# Patient Record
Sex: Female | Born: 1960 | Race: White | Hispanic: No | Marital: Married | Smoking: Former smoker
Health system: Southern US, Community
[De-identification: ages and names within clinical notes are randomized; demographics above are authoritative.]

## PROBLEM LIST (undated history)

## (undated) DIAGNOSIS — R002 Palpitations: Secondary | ICD-10-CM

## (undated) DIAGNOSIS — C801 Malignant (primary) neoplasm, unspecified: Secondary | ICD-10-CM

## (undated) DIAGNOSIS — K219 Gastro-esophageal reflux disease without esophagitis: Secondary | ICD-10-CM

## (undated) DIAGNOSIS — I10 Essential (primary) hypertension: Secondary | ICD-10-CM

## (undated) DIAGNOSIS — K635 Polyp of colon: Secondary | ICD-10-CM

## (undated) DIAGNOSIS — N879 Dysplasia of cervix uteri, unspecified: Secondary | ICD-10-CM

## (undated) DIAGNOSIS — E785 Hyperlipidemia, unspecified: Secondary | ICD-10-CM

## (undated) DIAGNOSIS — F329 Major depressive disorder, single episode, unspecified: Secondary | ICD-10-CM

## (undated) DIAGNOSIS — F418 Other specified anxiety disorders: Secondary | ICD-10-CM

## (undated) DIAGNOSIS — F32A Depression, unspecified: Secondary | ICD-10-CM

## (undated) DIAGNOSIS — E538 Deficiency of other specified B group vitamins: Secondary | ICD-10-CM

## (undated) DIAGNOSIS — I4891 Unspecified atrial fibrillation: Secondary | ICD-10-CM

## (undated) HISTORY — DX: Depression, unspecified: F32.A

## (undated) HISTORY — DX: Gastro-esophageal reflux disease without esophagitis: K21.9

## (undated) HISTORY — DX: Palpitations: R00.2

## (undated) HISTORY — PX: COLPOSCOPY: SHX161

## (undated) HISTORY — DX: Other specified anxiety disorders: F41.8

## (undated) HISTORY — PX: MOUTH SURGERY: SHX715

## (undated) HISTORY — DX: Polyp of colon: K63.5

## (undated) HISTORY — DX: Dysplasia of cervix uteri, unspecified: N87.9

## (undated) HISTORY — DX: Hyperlipidemia, unspecified: E78.5

## (undated) HISTORY — DX: Major depressive disorder, single episode, unspecified: F32.9

## (undated) HISTORY — DX: Malignant (primary) neoplasm, unspecified: C80.1

## (undated) HISTORY — DX: Essential (primary) hypertension: I10

## (undated) HISTORY — DX: Deficiency of other specified B group vitamins: E53.8

---
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
## 1985-02-13 HISTORY — PX: GYNECOLOGIC CRYOSURGERY: SHX857

## 1999-04-12 ENCOUNTER — Other Ambulatory Visit: Admission: RE | Admit: 1999-04-12 | Discharge: 1999-04-12 | Payer: Self-pay | Admitting: Family Medicine

## 2000-05-22 ENCOUNTER — Encounter: Admission: RE | Admit: 2000-05-22 | Discharge: 2000-05-22 | Payer: Self-pay | Admitting: Family Medicine

## 2000-05-22 ENCOUNTER — Encounter: Payer: Self-pay | Admitting: Family Medicine

## 2001-03-12 ENCOUNTER — Encounter: Payer: Self-pay | Admitting: Family Medicine

## 2001-03-12 ENCOUNTER — Encounter: Admission: RE | Admit: 2001-03-12 | Discharge: 2001-03-12 | Payer: Self-pay | Admitting: Family Medicine

## 2001-03-14 ENCOUNTER — Encounter: Payer: Self-pay | Admitting: Family Medicine

## 2001-03-14 ENCOUNTER — Encounter: Admission: RE | Admit: 2001-03-14 | Discharge: 2001-03-14 | Payer: Self-pay | Admitting: Family Medicine

## 2001-03-14 ENCOUNTER — Other Ambulatory Visit: Admission: RE | Admit: 2001-03-14 | Discharge: 2001-03-14 | Payer: Self-pay | Admitting: Family Medicine

## 2002-03-20 ENCOUNTER — Encounter: Payer: Self-pay | Admitting: Family Medicine

## 2002-03-20 ENCOUNTER — Encounter: Admission: RE | Admit: 2002-03-20 | Discharge: 2002-03-20 | Payer: Self-pay | Admitting: Family Medicine

## 2002-04-03 ENCOUNTER — Other Ambulatory Visit: Admission: RE | Admit: 2002-04-03 | Discharge: 2002-04-03 | Payer: Self-pay | Admitting: Family Medicine

## 2003-03-24 ENCOUNTER — Encounter: Admission: RE | Admit: 2003-03-24 | Discharge: 2003-03-24 | Payer: Self-pay | Admitting: Family Medicine

## 2003-04-14 ENCOUNTER — Other Ambulatory Visit: Admission: RE | Admit: 2003-04-14 | Discharge: 2003-04-14 | Payer: Self-pay | Admitting: Family Medicine

## 2003-05-21 ENCOUNTER — Encounter: Admission: RE | Admit: 2003-05-21 | Discharge: 2003-05-21 | Payer: Self-pay | Admitting: Family Medicine

## 2004-04-08 ENCOUNTER — Encounter: Admission: RE | Admit: 2004-04-08 | Discharge: 2004-04-08 | Payer: Self-pay | Admitting: Family Medicine

## 2004-04-13 ENCOUNTER — Other Ambulatory Visit: Admission: RE | Admit: 2004-04-13 | Discharge: 2004-04-13 | Payer: Self-pay | Admitting: Family Medicine

## 2004-11-22 ENCOUNTER — Encounter: Admission: RE | Admit: 2004-11-22 | Discharge: 2004-11-22 | Payer: Self-pay | Admitting: Family Medicine

## 2004-12-26 ENCOUNTER — Encounter: Admission: RE | Admit: 2004-12-26 | Discharge: 2004-12-26 | Payer: Self-pay | Admitting: Family Medicine

## 2005-04-25 ENCOUNTER — Encounter: Admission: RE | Admit: 2005-04-25 | Discharge: 2005-04-25 | Payer: Self-pay | Admitting: Family Medicine

## 2005-05-15 ENCOUNTER — Other Ambulatory Visit: Admission: RE | Admit: 2005-05-15 | Discharge: 2005-05-15 | Payer: Self-pay | Admitting: Family Medicine

## 2006-05-01 ENCOUNTER — Encounter: Admission: RE | Admit: 2006-05-01 | Discharge: 2006-05-01 | Payer: Self-pay | Admitting: Obstetrics and Gynecology

## 2006-05-17 ENCOUNTER — Other Ambulatory Visit: Admission: RE | Admit: 2006-05-17 | Discharge: 2006-05-17 | Payer: Self-pay | Admitting: Obstetrics and Gynecology

## 2007-05-20 ENCOUNTER — Encounter: Admission: RE | Admit: 2007-05-20 | Discharge: 2007-05-20 | Payer: Self-pay | Admitting: Family Medicine

## 2007-05-20 ENCOUNTER — Other Ambulatory Visit: Admission: RE | Admit: 2007-05-20 | Discharge: 2007-05-20 | Payer: Self-pay | Admitting: Obstetrics and Gynecology

## 2008-02-12 ENCOUNTER — Ambulatory Visit: Payer: Self-pay | Admitting: Obstetrics and Gynecology

## 2008-04-06 ENCOUNTER — Ambulatory Visit: Payer: Self-pay | Admitting: Obstetrics and Gynecology

## 2008-05-22 ENCOUNTER — Other Ambulatory Visit: Admission: RE | Admit: 2008-05-22 | Discharge: 2008-05-22 | Payer: Self-pay | Admitting: Obstetrics and Gynecology

## 2008-05-22 ENCOUNTER — Encounter: Admission: RE | Admit: 2008-05-22 | Discharge: 2008-05-22 | Payer: Self-pay | Admitting: Family Medicine

## 2008-05-22 ENCOUNTER — Ambulatory Visit: Payer: Self-pay | Admitting: Obstetrics and Gynecology

## 2008-05-22 ENCOUNTER — Encounter: Payer: Self-pay | Admitting: Obstetrics and Gynecology

## 2009-06-02 ENCOUNTER — Ambulatory Visit: Payer: Self-pay | Admitting: Obstetrics and Gynecology

## 2009-06-02 ENCOUNTER — Encounter: Admission: RE | Admit: 2009-06-02 | Discharge: 2009-06-02 | Payer: Self-pay | Admitting: Family Medicine

## 2009-06-02 ENCOUNTER — Other Ambulatory Visit: Admission: RE | Admit: 2009-06-02 | Discharge: 2009-06-02 | Payer: Self-pay | Admitting: Obstetrics and Gynecology

## 2010-04-14 ENCOUNTER — Emergency Department (HOSPITAL_COMMUNITY)
Admission: EM | Admit: 2010-04-14 | Discharge: 2010-04-15 | Disposition: A | Discharge status: Home / Self Care | Payer: BC Managed Care – PPO | Attending: Emergency Medicine | Admitting: Emergency Medicine

## 2010-04-14 ENCOUNTER — Emergency Department (HOSPITAL_COMMUNITY): Payer: BC Managed Care – PPO

## 2010-04-14 DIAGNOSIS — S0083XA Contusion of other part of head, initial encounter: Secondary | ICD-10-CM | POA: Insufficient documentation

## 2010-04-14 DIAGNOSIS — W108XXA Fall (on) (from) other stairs and steps, initial encounter: Secondary | ICD-10-CM | POA: Insufficient documentation

## 2010-04-14 DIAGNOSIS — Y929 Unspecified place or not applicable: Secondary | ICD-10-CM | POA: Insufficient documentation

## 2010-04-14 DIAGNOSIS — F1010 Alcohol abuse, uncomplicated: Secondary | ICD-10-CM | POA: Insufficient documentation

## 2010-04-14 DIAGNOSIS — I10 Essential (primary) hypertension: Secondary | ICD-10-CM | POA: Insufficient documentation

## 2010-04-14 DIAGNOSIS — S12300A Unspecified displaced fracture of fourth cervical vertebra, initial encounter for closed fracture: Secondary | ICD-10-CM | POA: Insufficient documentation

## 2010-04-14 DIAGNOSIS — S0100XA Unspecified open wound of scalp, initial encounter: Secondary | ICD-10-CM | POA: Insufficient documentation

## 2010-04-14 DIAGNOSIS — S1093XA Contusion of unspecified part of neck, initial encounter: Secondary | ICD-10-CM | POA: Insufficient documentation

## 2010-04-14 DIAGNOSIS — IMO0002 Reserved for concepts with insufficient information to code with codable children: Secondary | ICD-10-CM | POA: Insufficient documentation

## 2010-04-14 DIAGNOSIS — S0003XA Contusion of scalp, initial encounter: Secondary | ICD-10-CM | POA: Insufficient documentation

## 2010-04-14 LAB — COMPREHENSIVE METABOLIC PANEL
ALT: 19 U/L (ref 0–35)
AST: 28 U/L (ref 0–37)
Alkaline Phosphatase: 57 U/L (ref 39–117)
CO2: 27 mEq/L (ref 19–32)
Calcium: 9.8 mg/dL (ref 8.4–10.5)
Chloride: 94 mEq/L — ABNORMAL LOW (ref 96–112)
GFR calc Af Amer: >60 mL/min (ref >60)
GFR calc non Af Amer: >60 mL/min (ref >60)
Glucose, Bld: 96 mg/dL (ref 70–99)
Potassium: 3.6 mEq/L (ref 3.5–5.1)
Sodium: 134 mEq/L — ABNORMAL LOW (ref 135–145)

## 2010-04-14 LAB — DIFFERENTIAL
Basophils Absolute: 0 10*3/uL (ref 0.0–0.1)
Eosinophils Relative: 2 % (ref 0–5)
Lymphocytes Relative: 34 % (ref 12–46)
Lymphs Abs: 2.2 10*3/uL (ref 0.7–4.0)
Neutro Abs: 3.6 10*3/uL (ref 1.7–7.7)

## 2010-04-14 LAB — CBC
HCT: 37.1 % (ref 36.0–46.0)
Hemoglobin: 13.2 g/dL (ref 12.0–15.0)
MCV: 91.6 fL (ref 78.0–100.0)
RBC: 4.05 MIL/uL (ref 3.87–5.11)
RDW: 12.2 % (ref 11.5–15.5)
WBC: 6.6 10*3/uL (ref 4.0–10.5)

## 2010-05-13 ENCOUNTER — Other Ambulatory Visit: Payer: Self-pay | Admitting: Family Medicine

## 2010-05-13 DIAGNOSIS — Z1231 Encounter for screening mammogram for malignant neoplasm of breast: Secondary | ICD-10-CM

## 2010-06-06 ENCOUNTER — Ambulatory Visit: Payer: BC Managed Care – PPO

## 2010-06-07 ENCOUNTER — Encounter (INDEPENDENT_AMBULATORY_CARE_PROVIDER_SITE_OTHER): Payer: BC Managed Care – PPO | Admitting: Obstetrics and Gynecology

## 2010-06-07 ENCOUNTER — Other Ambulatory Visit: Payer: Self-pay | Admitting: Obstetrics and Gynecology

## 2010-06-07 ENCOUNTER — Other Ambulatory Visit (HOSPITAL_COMMUNITY)
Admission: RE | Admit: 2010-06-07 | Discharge: 2010-06-07 | Disposition: A | Discharge status: Home / Self Care | Payer: BC Managed Care – PPO | Source: Ambulatory Visit | Attending: Obstetrics and Gynecology | Admitting: Obstetrics and Gynecology

## 2010-06-07 ENCOUNTER — Ambulatory Visit
Admission: RE | Admit: 2010-06-07 | Discharge: 2010-06-07 | Disposition: A | Discharge status: Home / Self Care | Payer: BC Managed Care – PPO | Source: Ambulatory Visit | Attending: Family Medicine | Admitting: Family Medicine

## 2010-06-07 DIAGNOSIS — Z1231 Encounter for screening mammogram for malignant neoplasm of breast: Secondary | ICD-10-CM

## 2010-06-07 DIAGNOSIS — Z01419 Encounter for gynecological examination (general) (routine) without abnormal findings: Secondary | ICD-10-CM

## 2010-06-07 DIAGNOSIS — Z124 Encounter for screening for malignant neoplasm of cervix: Secondary | ICD-10-CM | POA: Insufficient documentation

## 2011-06-08 ENCOUNTER — Other Ambulatory Visit: Payer: Self-pay | Admitting: Family Medicine

## 2011-06-08 DIAGNOSIS — Z1231 Encounter for screening mammogram for malignant neoplasm of breast: Secondary | ICD-10-CM

## 2011-06-09 ENCOUNTER — Ambulatory Visit
Admission: RE | Admit: 2011-06-09 | Discharge: 2011-06-09 | Disposition: A | Discharge status: Home / Self Care | Payer: BC Managed Care – PPO | Source: Ambulatory Visit | Attending: Family Medicine | Admitting: Family Medicine

## 2011-06-09 DIAGNOSIS — Z1231 Encounter for screening mammogram for malignant neoplasm of breast: Secondary | ICD-10-CM

## 2011-06-12 ENCOUNTER — Encounter: Payer: Self-pay | Admitting: Gynecology

## 2011-06-12 DIAGNOSIS — N879 Dysplasia of cervix uteri, unspecified: Secondary | ICD-10-CM | POA: Insufficient documentation

## 2011-06-13 ENCOUNTER — Encounter: Payer: BC Managed Care – PPO | Admitting: Obstetrics and Gynecology

## 2011-06-14 ENCOUNTER — Encounter: Payer: Self-pay | Admitting: *Deleted

## 2011-06-29 ENCOUNTER — Encounter: Payer: Self-pay | Admitting: Obstetrics and Gynecology

## 2011-06-29 ENCOUNTER — Other Ambulatory Visit (HOSPITAL_COMMUNITY)
Admission: RE | Admit: 2011-06-29 | Discharge: 2011-06-29 | Disposition: A | Discharge status: Home / Self Care | Payer: BC Managed Care – PPO | Source: Ambulatory Visit | Attending: Obstetrics and Gynecology | Admitting: Obstetrics and Gynecology

## 2011-06-29 ENCOUNTER — Ambulatory Visit (INDEPENDENT_AMBULATORY_CARE_PROVIDER_SITE_OTHER): Payer: BC Managed Care – PPO | Admitting: Obstetrics and Gynecology

## 2011-06-29 VITALS — BP 140/84 | Ht 61.0 in | Wt 140.0 lb

## 2011-06-29 DIAGNOSIS — N951 Menopausal and female climacteric states: Secondary | ICD-10-CM

## 2011-06-29 DIAGNOSIS — F418 Other specified anxiety disorders: Secondary | ICD-10-CM | POA: Insufficient documentation

## 2011-06-29 DIAGNOSIS — R002 Palpitations: Secondary | ICD-10-CM | POA: Insufficient documentation

## 2011-06-29 DIAGNOSIS — Z01419 Encounter for gynecological examination (general) (routine) without abnormal findings: Secondary | ICD-10-CM | POA: Insufficient documentation

## 2011-06-29 DIAGNOSIS — Z780 Asymptomatic menopausal state: Secondary | ICD-10-CM

## 2011-06-29 NOTE — Progress Notes (Signed)
Patient came to see me today for her annual GYN exam. She is menopausal and does well without HRT. She does have some vaginal dryness but at the moment is tolerable she is having no vaginal bleeding. She is having no pelvic pain. She has been treated by her PCP for hypertension. Last week she was in [place] and developed a severe headache and her systolic was over 200. Her PCP is now admitted a beta blocker and she's doing much better. Her cholesterol is elevated. She is working on diet and exercise and at some point may require statin. She is up-to-date on mammograms. She had one bone density in 2010 which was normal but she does not take estrogen. She had cryosurgery for CIN in 1987 but normal Paps since then. We discussed less frequent Pap smears but she requested we do one today.  Physical examination: Kennon Portela present. HEENT within normal limits. Neck: Thyroid not large. No masses. Supraclavicular nodes: not enlarged. Breasts: Examined in both sitting and lying  position. No skin changes and no masses. Abdomen: Soft no guarding rebound or masses or hernia. Pelvic: External: Within normal limits. BUS: Within normal limits. Vaginal:within normal limits. Good estrogen effect. No evidence of cystocele rectocele or enterocele. Cervix: clean. Uterus: Normal size and shape. Adnexa: No masses. Rectovaginal exam: Confirmatory and negative. Extremities: Within normal limits.  Assessment #1. Normal GYN exam #2. History of CIN with cryosurgery  Plan: Continue yearly mammograms. Bone density.

## 2011-06-29 NOTE — Patient Instructions (Signed)
Schedule colonoscopy at 51.

## 2012-05-17 ENCOUNTER — Other Ambulatory Visit: Payer: Self-pay

## 2012-05-17 DIAGNOSIS — Z1231 Encounter for screening mammogram for malignant neoplasm of breast: Secondary | ICD-10-CM

## 2012-06-19 ENCOUNTER — Ambulatory Visit
Admission: RE | Admit: 2012-06-19 | Discharge: 2012-06-19 | Disposition: A | Discharge status: Home / Self Care | Payer: BC Managed Care – PPO | Source: Ambulatory Visit

## 2012-06-19 DIAGNOSIS — Z1231 Encounter for screening mammogram for malignant neoplasm of breast: Secondary | ICD-10-CM

## 2012-08-26 HISTORY — PX: COLONOSCOPY W/ BIOPSIES: SHX1374

## 2013-02-26 ENCOUNTER — Encounter: Payer: Self-pay | Admitting: Internal Medicine

## 2013-04-09 ENCOUNTER — Ambulatory Visit (INDEPENDENT_AMBULATORY_CARE_PROVIDER_SITE_OTHER): Payer: BC Managed Care – PPO | Admitting: Internal Medicine

## 2013-04-09 ENCOUNTER — Encounter: Payer: Self-pay | Admitting: Internal Medicine

## 2013-04-09 VITALS — BP 132/68 | HR 88 | Ht 61.0 in | Wt 148.0 lb

## 2013-04-09 DIAGNOSIS — K219 Gastro-esophageal reflux disease without esophagitis: Secondary | ICD-10-CM

## 2013-04-09 DIAGNOSIS — R12 Heartburn: Secondary | ICD-10-CM

## 2013-04-09 MED ORDER — PANTOPRAZOLE SODIUM 40 MG PO TBEC: 40.0000 mg | DELAYED_RELEASE_TABLET | Freq: Every day | ORAL | Status: DC

## 2013-04-09 NOTE — Progress Notes (Signed)
Subjective:  Referred by Florina Ou, MD  Patient ID: Gwendolyn Thompson, female    DOB: Sep 10, 1960, 53 y.o.   MRN: [id number]  HPI The patient is a pleasant middle-aged woman with a several month if not longer hx of nausea and intermittent urgent regurgitation. It often occurs with running. There is also heartburn and nausea and a gagging sensation at times. No dysphagia, weight loss, anemia known. She does not have chest pain, doe. Weight has gone up with BP med changes (Dr. Einar Gip).  She finds Tums and Gaviscon help her sxs.   GI ROS o/w negative. Allergies  Allergen Reactions  . Penicillins    Outpatient Prescriptions Prior to Visit  Medication Sig Dispense Refill  . ALPRAZolam (XANAX) 0.5 MG tablet Take 0.5 mg by mouth as needed.      . Ascorbic Acid (VITAMIN C PO) Take by mouth.      Marland Kitchen aspirin 81 MG tablet Take 81 mg by mouth daily.      . Calcium Carbonate-Vitamin D (CALCIUM + D PO) Take by mouth.      . Cetirizine HCl (ZYRTEC ALLERGY PO) Take by mouth.      . Coenzyme Q10 (CO Q 10 PO) Take by mouth.      . Omega-3 Fatty Acids (FISH OIL PO) Take by mouth.      . Sertraline HCl (ZOLOFT PO) Take 150 mg by mouth.       . hydrochlorothiazide (HYDRODIURIL) 25 MG tablet Take 25 mg by mouth daily.      Marland Kitchen LISINOPRIL PO Take 10 mg by mouth.       Marland Kitchen PRESCRIPTION MEDICATION Beta Blocker       No facility-administered medications prior to visit.   Past Medical History  Diagnosis Date  . Cervical dysplasia   . Palpitations   . HTN (hypertension)   . Anxious depression   . Hyperplastic colon polyp    Past Surgical History  Procedure Laterality Date  . Colposcopy    . Gynecologic cryosurgery  1987  . Mouth surgery    . Colonoscopy w/ biopsies  08-26-2012    Dr. Glennon Hamilton    History   Social History  . Marital Status: Single    Spouse Name: N/A    Number of Children: N/A  . Years of Education: N/A   Occupational History  . tax Optometrist    Social History Main Topics  .  Smoking status: Former Smoker    Quit date: 02/14/1988  . Smokeless tobacco: Never Used  . Alcohol Use: 1.0 oz/week    2 drink(s) per week  . Drug Use: No  . Sexual Activity: Yes    Birth Control/ Protection: Post-menopausal     Family History  Problem Relation Age of Onset  . Hypertension Mother   . Alzheimer's disease Father   . Leukemia Father   . Diabetes Maternal Grandmother   . Alzheimer's disease Maternal Grandmother   . Diabetes Paternal Grandfather   . Heart disease Sister   . Alzheimer's disease Paternal Uncle    Review of Systems As per HPI  And  O/w negative complete ROS    Objective:   Physical Exam General:  Well-developed, well-nourished and in no acute distress Eyes:  anicteric. ENT:   Mouth and posterior pharynx free of lesions.  Neck:   supple w/o thyromegaly or mass.  Lungs: Clear to auscultation bilaterally. Heart:  S1S2, no rubs, murmurs, gallops. Abdomen:  soft,  non-tender, no hepatosplenomegaly, hernia, or mass and BS+.  Lymph:  no cervical or supraclavicular adenopathy. Extremities:   no edema Skin   no rash, tanned. Neuro:  A&O x 3.  Psych:  appropriate mood and  Affect.   Data Reviewed: PCP notes, labs (01/2013 - NL CBC, CMET), colonoscopy 08/2012 and pathology (hyperplastic polyp)     Assessment & Plan:  Heartburn - Plan: pantoprazole (PROTONIX) 40 MG tablet  Esophageal reflux - Plan: pantoprazole (PROTONIX) 40 MG tablet  1. I suspect here sxs are from GERD and we have decided to use daily PPI and f/u 2 months. 2. If persistent problems consider EGD or UGI as hiatal hernia could be a problem. 3. If responds then consider dose reduction of PPI 4. Sxs do not sound cardiac to me  I appreciate the opportunity to care for this patient.  SF:SELTR, TAMMY, MD

## 2013-04-09 NOTE — Patient Instructions (Signed)
We have sent the following medications to your pharmacy for you to pick up at your convenience: Pantoprazole  Today we are giving you a handout to read and follow on GERD.  Follow up with [place] in 2 months.   I appreciate the opportunity to care for you.

## 2013-04-11 ENCOUNTER — Encounter: Payer: Self-pay | Admitting: Internal Medicine

## 2013-06-04 ENCOUNTER — Ambulatory Visit: Payer: BC Managed Care – PPO | Admitting: Internal Medicine

## 2013-06-10 ENCOUNTER — Other Ambulatory Visit: Payer: Self-pay

## 2013-06-10 DIAGNOSIS — Z1231 Encounter for screening mammogram for malignant neoplasm of breast: Secondary | ICD-10-CM

## 2013-06-12 ENCOUNTER — Other Ambulatory Visit: Payer: Self-pay

## 2013-07-10 ENCOUNTER — Other Ambulatory Visit: Payer: Self-pay | Admitting: Internal Medicine

## 2013-07-17 ENCOUNTER — Encounter: Payer: Self-pay | Admitting: Internal Medicine

## 2013-07-17 ENCOUNTER — Encounter (INDEPENDENT_AMBULATORY_CARE_PROVIDER_SITE_OTHER): Payer: Self-pay

## 2013-07-17 ENCOUNTER — Ambulatory Visit (INDEPENDENT_AMBULATORY_CARE_PROVIDER_SITE_OTHER): Payer: BC Managed Care – PPO | Admitting: Internal Medicine

## 2013-07-17 ENCOUNTER — Ambulatory Visit
Admission: RE | Admit: 2013-07-17 | Discharge: 2013-07-17 | Disposition: A | Discharge status: Home / Self Care | Payer: BC Managed Care – PPO | Source: Ambulatory Visit

## 2013-07-17 ENCOUNTER — Ambulatory Visit: Payer: BC Managed Care – PPO

## 2013-07-17 VITALS — BP 104/66 | HR 100 | Ht 61.0 in | Wt 148.6 lb

## 2013-07-17 DIAGNOSIS — F411 Generalized anxiety disorder: Secondary | ICD-10-CM

## 2013-07-17 DIAGNOSIS — Z1231 Encounter for screening mammogram for malignant neoplasm of breast: Secondary | ICD-10-CM

## 2013-07-17 DIAGNOSIS — R12 Heartburn: Secondary | ICD-10-CM

## 2013-07-17 DIAGNOSIS — K219 Gastro-esophageal reflux disease without esophagitis: Secondary | ICD-10-CM

## 2013-07-17 NOTE — Patient Instructions (Addendum)
Please take an Anti-Acid before exercising.  If that doesn't help use a xanax before exercising.    Follow up with [place] in 2 months, appointment made for 09/17/13 at 1:30pm.   I appreciate the opportunity to care for you.

## 2013-07-17 NOTE — Progress Notes (Signed)
Subjective:    Patient ID: Gwendolyn Thompson, female    DOB: October 16, 1960, 53 y.o.   MRN: [id number]  HPI The patient returns for followup of GERD problems and gagging. Nausea also. She was seen in January and PPI therapy was recommended with followup. Since last visit - ill twice - flu, sinus infection  Felt heartbburn when late on PPI Gagging with exercise, esp if waiting too long after breakfast when she exercises will feel hungry and gag - 2-3 x in past month  Allergies  Allergen Reactions  . Penicillins    Outpatient Prescriptions Prior to Visit  Medication Sig Dispense Refill  . ALPRAZolam (XANAX) 0.5 MG tablet Take 0.5 mg by mouth as needed.      . AMBULATORY NON FORMULARY MEDICATION [facility] Immune Booster  2 tablets at breakfast      . AMBULATORY NON FORMULARY MEDICATION [facility] Herbal Respiratory Booster 2 tablets at breakfast      . amLODipine (NORVASC) 5 MG tablet Take 5 mg by mouth daily.      . Ascorbic Acid (VITAMIN C PO) Take by mouth.      Marland Kitchen aspirin 81 MG tablet Take 81 mg by mouth daily.      . calcium carbonate (TUMS - DOSED IN MG ELEMENTAL CALCIUM) 500 MG chewable tablet Chew 1 tablet by mouth as needed for indigestion or heartburn.      . Calcium Carbonate-Vitamin D (CALCIUM + D PO) Take by mouth.      . Cetirizine HCl (ZYRTEC ALLERGY PO) Take by mouth.      . Coenzyme Q10 (CO Q 10 PO) Take by mouth.      . nebivolol (BYSTOLIC) 10 MG tablet Take 5 mg by mouth daily.       . Omega-3 Fatty Acids (FISH OIL PO) Take by mouth.      . pantoprazole (PROTONIX) 40 MG tablet TAKE 1 TABLET (40 MG TOTAL) BY MOUTH DAILY BEFORE BREAKFAST.  30 tablet  2  . spironolactone (ALDACTONE) 25 MG tablet Take 25 mg by mouth daily.      Marland Kitchen venlafaxine (EFFEXOR) 75 MG tablet Take 75 mg by mouth at bedtime.       No facility-administered medications prior to visit.   Past Medical History  Diagnosis Date  . Cervical dysplasia   . Palpitations   . HTN (hypertension)   . Anxious  depression   . Hyperplastic colon polyp   . Esophageal reflux    Past Surgical History  Procedure Laterality Date  . Colposcopy    . Gynecologic cryosurgery  1987  . Mouth surgery    . Colonoscopy w/ biopsies  08-26-2012    Dr. Glennon Hamilton    History   Social History  . Marital Status: Single    Spouse Name: N/A    Number of Children: N/A  . Years of Education: N/A   Occupational History  . tax Optometrist    Social History Main Topics  . Smoking status: Former Smoker    Quit date: 02/14/1988  . Smokeless tobacco: Never Used  . Alcohol Use: 1.0 oz/week    2 drink(s) per week  . Drug Use: No  . Sexual Activity: Yes    Birth Control/ Protection: Post-menopausal   Other Topics Concern  . None   Social History Narrative   Single, no children   Tax Optometrist   2 caffeine beverages daily   Family History  Problem Relation Age of Onset  . Hypertension Mother   .  Alzheimer's disease Father   . Leukemia Father   . Diabetes Maternal Grandmother   . Alzheimer's disease Maternal Grandmother   . Diabetes Paternal Grandfather   . Heart disease Sister   . Alzheimer's disease Paternal Uncle        Review of Systems As above some anxiety and stress, concerned about gaining weight and overall health problems is developed in the past few years    Objective:   Physical Exam General:  NAD Eyes:   anicteric Abdomen:  soft and nontender, BS+    Assessment & Plan:   1. Heartburn   2. Esophageal reflux   3. Anxiety state, health-related    1. I tried to reassure as best I could. I can't tell if her symptoms are coming really from anxiety or GERD or both. There certainly atypical for GERD in that she tends to get symptoms when she exercises and if she is hungry. 2. We discussed the options of other therapeutic trial, upper GI series, upper GI endoscopy. 3. At this point she will try Gaviscon prior to exercising to see if that limits these relatively infrequent episodes and if  that fails to work I want her to try some of her alprazolam prior to exercising and see if that does. 4. She will return in 2 months, but call back sooner if she has not helped and we will most likely proceed with testing with upper GI series her EGD, I think EGD makes the most sense here. 5. She has a visit with Dr. Modena Morrow relatively soon and will have labs done there and she can have those forwarded to me. My index of suspicion of something serious is low she may need testing for reassurance however. Note that some of her cardiac medications could be related. Amlodipine is a calcium channel blocker which may predispose more reflux than she's been on that for some time.  ID:POEUM, TAMMY, MD

## 2013-09-17 ENCOUNTER — Ambulatory Visit (INDEPENDENT_AMBULATORY_CARE_PROVIDER_SITE_OTHER): Payer: BC Managed Care – PPO | Admitting: Internal Medicine

## 2013-09-17 ENCOUNTER — Encounter: Payer: Self-pay | Admitting: Internal Medicine

## 2013-09-17 VITALS — BP 140/80 | HR 68 | Ht 60.75 in | Wt 148.0 lb

## 2013-09-17 DIAGNOSIS — E538 Deficiency of other specified B group vitamins: Secondary | ICD-10-CM | POA: Insufficient documentation

## 2013-09-17 DIAGNOSIS — K219 Gastro-esophageal reflux disease without esophagitis: Secondary | ICD-10-CM | POA: Insufficient documentation

## 2013-09-17 DIAGNOSIS — F411 Generalized anxiety disorder: Secondary | ICD-10-CM | POA: Insufficient documentation

## 2013-09-17 HISTORY — DX: Deficiency of other specified B group vitamins: E53.8

## 2013-09-17 NOTE — Patient Instructions (Signed)
Follow up with [place] as needed.   Consider counseling.     I appreciate the opportunity to care for you.

## 2013-09-17 NOTE — Progress Notes (Signed)
   Subjective:    Patient ID: Gwendolyn Thompson, female    DOB: [birth year], 53 y.o.   MRN: [id number]  HPI The patient returns, reporting that as long as she takes her daily pantoprazole and as needed antacids before exercising she does not have heartburn or regurgitation symptoms. She has had some episodes when she's had coffee on an empty stomach. She does admit to a fair amount of anxiety as well and stressors. She has gotten off medication for that because she was on numerous medications. She is previously participated in counseling but not for some time. She is open doing that. She does not sleep well either.  We reviewed her labs to her patient portal and she recently had a CBC, TSH, comprehensive metabolic panel, all of which were okay. She had a borderline B12 of 210 and was started on oral supplementation. Medications, allergies, past medical history, past surgical history, family history and social history are reviewed and updated in the EMR.   Review of Systems As above    Objective:   Physical Exam NAD    Assessment & Plan:  Esophageal reflux Better with PPI Will work on diet also Stay on PPI F/u prn No egd as no warning signs  Anxiety state, unspecified Affecting her overall - decided to get off meds Has been to counselor before - suggested she try again  B12 deficiency-borderline Continue oral supplementation  I appreciate the opportunity to care for this patient.  CC: Florina Ou, MD

## 2013-09-17 NOTE — Assessment & Plan Note (Signed)
Better with PPI Will work on diet also Stay on PPI F/u prn No egd as no warning signs

## 2013-09-17 NOTE — Assessment & Plan Note (Signed)
Continue oral supplementation

## 2013-09-17 NOTE — Assessment & Plan Note (Addendum)
Affecting her overall - decided to get off meds Has been to counselor before - suggested she try again

## 2013-10-09 ENCOUNTER — Other Ambulatory Visit: Payer: Self-pay | Admitting: Internal Medicine

## 2013-12-12 ENCOUNTER — Encounter: Payer: Self-pay | Admitting: Internal Medicine

## 2014-09-07 ENCOUNTER — Other Ambulatory Visit: Payer: Self-pay

## 2014-09-07 DIAGNOSIS — Z1231 Encounter for screening mammogram for malignant neoplasm of breast: Secondary | ICD-10-CM

## 2014-10-09 ENCOUNTER — Ambulatory Visit (INDEPENDENT_AMBULATORY_CARE_PROVIDER_SITE_OTHER): Payer: BLUE CROSS/BLUE SHIELD | Admitting: Internal Medicine

## 2014-10-09 ENCOUNTER — Encounter: Payer: Self-pay | Admitting: Internal Medicine

## 2014-10-09 VITALS — BP 132/88 | HR 80 | Ht 61.0 in | Wt 149.1 lb

## 2014-10-09 DIAGNOSIS — K219 Gastro-esophageal reflux disease without esophagitis: Secondary | ICD-10-CM

## 2014-10-09 DIAGNOSIS — R112 Nausea with vomiting, unspecified: Secondary | ICD-10-CM

## 2014-10-09 DIAGNOSIS — F411 Generalized anxiety disorder: Secondary | ICD-10-CM | POA: Diagnosis not present

## 2014-10-09 DIAGNOSIS — R1110 Vomiting, unspecified: Secondary | ICD-10-CM

## 2014-10-09 NOTE — Assessment & Plan Note (Signed)
Likely driving problems here Needs cognitive based Tx I think plus review of meds as effexor might have caused BP increase ? OCD component

## 2014-10-09 NOTE — Progress Notes (Signed)
Subjective:    Patient ID: Gwendolyn Thompson, female    DOB: 02-Sep-1960, 54 y.o.   MRN: [id number] Cc: reflux, gagging HPI The patient returns for follow-up. She had been seen in the past for what I thought was probably reflux in the setting of anxiety. She seemed to be better on a PPI when I saw her last year. However since then she's had progressively increasing symptoms. What she is having is intermittent gagging or retching episodes. She actually had one here she says if she starts to think about it a will happen and she basically has dry heaves for a few seconds or so. 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 She has noticed when she takes her alprazolam for anxiety she does not have this problem. She denies overt heartburn and there is no dysphagia though sometimes she holds food at the back of her mouth and then has to drink water to make it go down. She has had situational stressors which make things worse such as the death of her partners mother but she also has periods at times her she just has free-floating anxiety. She is still frustrated about being on multiple medications and has fatigue, she is on antihypertensives. She has been on venlafaxine XR for a number of years. She was not aware that that could contribute to hypertension. Her primary care practice is closing and she needs to seek in [place]. She has not seen a psychologist or psychiatrist for anxiety issues in the past. She admits she is a type a compulsive-like person.  She is also recently aware of someone's family member being diagnosed with esophageal cancer and she is fearful of that occurring in her.  Medications, allergies, past medical history, past surgical history, family history and social history are reviewed and updated in the EMR.  Review of Systems As per history of present illness      Objective:   Physical Exam  BP 132/88 mmHg  Pulse 80  Ht 5\' 1"  (1.549 m)  Wt 149 lb 2 oz (67.643 kg)  BMI 28.19 kg/m2 No acute distress    Assessment & Plan:   1. Gastroesophageal reflux disease, esophagitis presence not specified   2. Dry heaves   3. Anxiety state    I think she has a psychophysiologic phenomena and most likely. A functional disturbance of her GI tract probably triggered by anxiety and stress. I think upper endoscopy though, is necessary to exclude any organic issues. She is fearful of having cancer so I think it will help her to have that excluded. I have scheduled her for an upper GI endoscopy.The risks and benefits as well as alternatives of endoscopic procedure(s) have been discussed and reviewed. All questions answered. The patient agrees to proceed.   Subsequent to that or perhaps before she can manage and she needs another primary care provider to look over her medications. I suspect she could benefit from a change in therapy I don't know if her hypertension is reversible if she comes often lacks saphenous a don't know for sure that that caused it but it could be related.  I also mentioned the possibility of of cognitive-based psychotherapy to try to help her with her issues and perhaps even seeing a psychiatrist for medication.   25 minutes time spent with patient > half in counseling  coordination of care

## 2014-10-09 NOTE — Patient Instructions (Addendum)
You have been scheduled for an endoscopy. Please follow written instructions given to you at your visit today. If you use inhalers (even only as needed), please bring them with you on the day of your procedure.   Per Dr Carlean Purl you may stop your pantoprazole by cutting back to every other day and then stopping.  I appreciate the opportunity to care for you.

## 2014-10-09 NOTE — Assessment & Plan Note (Signed)
EGD Probably all functional

## 2014-10-15 ENCOUNTER — Ambulatory Visit (AMBULATORY_SURGERY_CENTER): Payer: BLUE CROSS/BLUE SHIELD | Admitting: Internal Medicine

## 2014-10-15 ENCOUNTER — Encounter: Payer: Self-pay | Admitting: Internal Medicine

## 2014-10-15 VITALS — BP 124/79 | HR 74 | Temp 97.4°F | Resp 22 | Ht 61.0 in | Wt 149.0 lb

## 2014-10-15 DIAGNOSIS — K319 Disease of stomach and duodenum, unspecified: Secondary | ICD-10-CM | POA: Diagnosis not present

## 2014-10-15 DIAGNOSIS — K2990 Gastroduodenitis, unspecified, without bleeding: Secondary | ICD-10-CM | POA: Diagnosis not present

## 2014-10-15 DIAGNOSIS — K219 Gastro-esophageal reflux disease without esophagitis: Secondary | ICD-10-CM

## 2014-10-15 DIAGNOSIS — K2970 Gastritis, unspecified, without bleeding: Secondary | ICD-10-CM

## 2014-10-15 MED ORDER — SODIUM CHLORIDE 0.9 % IV SOLN
500.0000 mL | INTRAVENOUS | Status: DC
Start: 2014-10-15 — End: 2014-10-15

## 2014-10-15 NOTE — Progress Notes (Signed)
Transferred to recovery room. A/O x3, pleased with MAC.  VSS.  Report to Penny, RN. 

## 2014-10-15 NOTE — Patient Instructions (Addendum)
I found some gastritis - inflammation in stomach and took biopsies. It does not have anything to do with your symptoms but if there is an infection we will treat it.  I will call results and plans.  I appreciate the opportunity to care for you. Gatha Mayer, MD, FACG   YOU HAD AN ENDOSCOPIC PROCEDURE TODAY AT [facility]:   Refer to the procedure report that was given to you for any specific questions about what was found during the examination.  If the procedure report does not answer your questions, please call your gastroenterologist to clarify.  If you requested that your care partner not be given the details of your procedure findings, then the procedure report has been included in a sealed envelope for you to review at your convenience later.  YOU SHOULD EXPECT: Some feelings of bloating in the abdomen. Passage of more gas than usual.  Walking can help get rid of the air that was put into your GI tract during the procedure and reduce the bloating. If you had a lower endoscopy (such as a colonoscopy or flexible sigmoidoscopy) you may notice spotting of blood in your stool or on the toilet paper. If you underwent a bowel prep for your procedure, you may not have a normal bowel movement for a few days.  Please Note:  You might notice some irritation and congestion in your nose or some drainage.  This is from the oxygen used during your procedure.  There is no need for concern and it should clear up in a day or so.  SYMPTOMS TO REPORT IMMEDIATELY:     Following upper endoscopy (EGD)  Vomiting of blood or coffee ground material  New chest pain or pain under the shoulder blades  Painful or persistently difficult swallowing  New shortness of breath  Fever of 100F or higher  Black, tarry-looking stools  For urgent or emergent issues, a gastroenterologist can be reached at any hour by calling [phone].   DIET: Your first meal following the procedure  should be a small meal and then it is ok to progress to your normal diet. Heavy or fried foods are harder to digest and may make you feel nauseous or bloated.  Likewise, meals heavy in dairy and vegetables can increase bloating.  Drink plenty of fluids but you should avoid alcoholic beverages for 24 hours.  ACTIVITY:  You should plan to take it easy for the rest of today and you should NOT DRIVE or use heavy machinery until tomorrow (because of the sedation medicines used during the test).    FOLLOW UP: Our staff will call the number listed on your records the next business day following your procedure to check on you and address any questions or concerns that you may have regarding the information given to you following your procedure. If we do not reach you, we will leave a message.  However, if you are feeling well and you are not experiencing any problems, there is no need to return our call.  We will assume that you have returned to your regular daily activities without incident.  If any biopsies were taken you will be contacted by phone or by letter within the next 1-3 weeks.  Please call us at [phone] if you have not heard about the biopsies in 3 weeks.    SIGNATURES/CONFIDENTIALITY: You and/or your care partner have signed paperwork which will be entered into your electronic medical record.  These signatures  attest to the fact that that the information above on your After Visit Summary has been reviewed and is understood.  Full responsibility of the confidentiality of this discharge information lies with you and/or your care-partner.   Information on Gastritis given to you today

## 2014-10-15 NOTE — Progress Notes (Signed)
Called to room to assist during endoscopic procedure.  Patient ID and intended procedure confirmed with present staff. Received instructions for my participation in the procedure from the performing physician.  

## 2014-10-15 NOTE — Op Note (Signed)
[facility]  [address] [place], [id number]   ENDOSCOPY PROCEDURE REPORT  PATIENT: Gwendolyn, Thompson  MR#: [id number] BIRTHDATE: 12/04/1960 , 81  yrs. old GENDER: female ENDOSCOPIST: Gatha Mayer, MD, [facility] PROCEDURE DATE:  10/15/2014 PROCEDURE:  EGD w/ biopsy ASA CLASS:     Class II INDICATIONS:  heartburn and dysphagia. MEDICATIONS: Propofol 160 TOPICAL ANESTHETIC: none  DESCRIPTION OF PROCEDURE: After the risks benefits and alternatives of the procedure were thoroughly explained, informed consent was obtained.  The [facility] [id number] endoscope was introduced through the mouth and advanced to the second portion of the duodenum , Without limitations.  The instrument was slowly withdrawn as the mucosa was fully examined.    1) mild-moderate distal gatsritis - biopsied (erythematous and mottled).   2) Otherwise normal EGD.  Retroflexed views revealed as previously described.     The scope was then withdrawn from the patient and the procedure completed.  COMPLICATIONS: There were no immediate complications.  ENDOSCOPIC IMPRESSION: 1.   1) mild-moderate distal gatsritis - biopsied (erythematous and mottled) 2.   2) Otherwise normal EGD  RECOMMENDATIONS: Office will call with results  REPEAT EXAM:  eSigned:  Gatha Mayer, MD, [facility] 10/15/2014 7:57 AM    CC:The Patient

## 2014-10-16 ENCOUNTER — Telehealth: Payer: Self-pay | Admitting: *Deleted

## 2014-10-16 NOTE — Telephone Encounter (Signed)
  Follow up Call-  Call back number 10/15/2014  Post procedure Call Back phone  # [phone]  Permission to leave phone message Yes     Patient questions:  Message left to call us if necessary.

## 2014-10-21 ENCOUNTER — Ambulatory Visit
Admission: RE | Admit: 2014-10-21 | Discharge: 2014-10-21 | Disposition: A | Discharge status: Home / Self Care | Payer: BLUE CROSS/BLUE SHIELD | Source: Ambulatory Visit

## 2014-10-21 DIAGNOSIS — Z1231 Encounter for screening mammogram for malignant neoplasm of breast: Secondary | ICD-10-CM

## 2014-10-26 ENCOUNTER — Telehealth: Payer: Self-pay | Admitting: Internal Medicine

## 2014-10-26 NOTE — Progress Notes (Signed)
Quick Note:  Biopsies are ok She needs to schedule w/ psychology to evaluate anxiety and physical sxs (gagging, esophageal sxs) Gwendolyn Thompson [facility] psychology please   See me 2 months  Call from office please [place] no recall or letter   ______

## 2014-10-26 NOTE — Telephone Encounter (Signed)
See pathology report for additional details 

## 2015-01-01 ENCOUNTER — Ambulatory Visit: Payer: BLUE CROSS/BLUE SHIELD | Admitting: Internal Medicine

## 2015-09-23 ENCOUNTER — Other Ambulatory Visit: Payer: Self-pay | Admitting: Obstetrics & Gynecology

## 2015-09-23 ENCOUNTER — Other Ambulatory Visit: Payer: Self-pay | Admitting: General Practice

## 2015-09-23 DIAGNOSIS — Z1231 Encounter for screening mammogram for malignant neoplasm of breast: Secondary | ICD-10-CM

## 2015-10-27 ENCOUNTER — Ambulatory Visit
Admission: RE | Admit: 2015-10-27 | Discharge: 2015-10-27 | Disposition: A | Discharge status: Home / Self Care | Payer: BLUE CROSS/BLUE SHIELD | Source: Ambulatory Visit | Attending: Family Medicine | Admitting: Family Medicine

## 2015-10-27 DIAGNOSIS — Z1231 Encounter for screening mammogram for malignant neoplasm of breast: Secondary | ICD-10-CM

## 2016-12-22 ENCOUNTER — Other Ambulatory Visit: Payer: Self-pay | Admitting: Obstetrics & Gynecology

## 2016-12-22 DIAGNOSIS — Z1231 Encounter for screening mammogram for malignant neoplasm of breast: Secondary | ICD-10-CM

## 2017-01-22 ENCOUNTER — Ambulatory Visit: Payer: BLUE CROSS/BLUE SHIELD

## 2017-02-08 ENCOUNTER — Encounter: Payer: Self-pay | Admitting: Obstetrics & Gynecology

## 2017-02-08 ENCOUNTER — Ambulatory Visit (INDEPENDENT_AMBULATORY_CARE_PROVIDER_SITE_OTHER): Payer: BLUE CROSS/BLUE SHIELD | Admitting: Obstetrics & Gynecology

## 2017-02-08 VITALS — BP 134/86 | Ht 61.0 in | Wt 137.0 lb

## 2017-02-08 DIAGNOSIS — Z13820 Encounter for screening for osteoporosis: Secondary | ICD-10-CM

## 2017-02-08 DIAGNOSIS — Z780 Asymptomatic menopausal state: Secondary | ICD-10-CM | POA: Diagnosis not present

## 2017-02-08 DIAGNOSIS — Z01419 Encounter for gynecological examination (general) (routine) without abnormal findings: Secondary | ICD-10-CM

## 2017-02-08 NOTE — Progress Notes (Signed)
Gwendolyn Thompson 1960-12-14 [id number]   History:    56 y.o. G0  Same sex wife (married x 05/2015)  RP:  Established patient presenting  for annual gyn exam   HPI: Menopausal, well on no hormone replacement therapy.  No postmenopausal bleeding.  No pelvic pain.  Sexually active with same sex wife, not exchanging intravaginal toys.  Breasts normal.  Urine and bowel movements normal.  Physically active regularly with walking and running.  Body mass index 25.89.  Past medical history,surgical history, family history and social history were all reviewed and documented in the EPIC chart.  Gynecologic History No LMP recorded. Patient is postmenopausal. Contraception: post menopausal status, same sex partner Last Pap: At [employer] [facility]. Results were: normal Last mammogram: 2017. Results were: normal Colonoscopy 2014.  Small Polyp removed, patho not available, will call Gastro to know if should have Colono at 6 or 11 yrs No recent Bone Density, will schedule here  Obstetric History OB History  Gravida Para Term Preterm AB Living  0            SAB TAB Ectopic Multiple Live Births                    ROS: A ROS was performed and pertinent positives and negatives are included in the history.  GENERAL: No fevers or chills. HEENT: No change in vision, no earache, sore throat or sinus congestion. NECK: No pain or stiffness. CARDIOVASCULAR: No chest pain or pressure. No palpitations. PULMONARY: No shortness of breath, cough or wheeze. GASTROINTESTINAL: No abdominal pain, nausea, vomiting or diarrhea, melena or bright red blood per rectum. GENITOURINARY: No urinary frequency, urgency, hesitancy or dysuria. MUSCULOSKELETAL: No joint or muscle pain, no back pain, no recent trauma. DERMATOLOGIC: No rash, no itching, no lesions. ENDOCRINE: No polyuria, polydipsia, no heat or cold intolerance. No recent change in weight. HEMATOLOGICAL: No anemia or easy bruising or bleeding. NEUROLOGIC: No headache, seizures,  numbness, tingling or weakness. PSYCHIATRIC: No depression, no loss of interest in normal activity or change in sleep pattern.     Exam:   BP 134/86   Ht 5\' 1"  (1.549 m)   Wt 137 lb (62.1 kg)   BMI 25.89 kg/m   Body mass index is 25.89 kg/m.  General appearance : Well developed well nourished female. No acute distress HEENT: Eyes: no retinal hemorrhage or exudates,  Neck supple, trachea midline, no carotid bruits, no thyroidmegaly Lungs: Clear to auscultation, no rhonchi or wheezes, or rib retractions  Heart: Regular rate and rhythm, no murmurs or gallops Breast:Examined in sitting and supine position were symmetrical in appearance, no palpable masses or tenderness,  no skin retraction, no nipple inversion, no nipple discharge, no skin discoloration, no axillary or supraclavicular lymphadenopathy Abdomen: no palpable masses or tenderness, no rebound or guarding Extremities: no edema or skin discoloration or tenderness  Pelvic: Vulva normal  Bartholin, Urethra, Skene Glands: Within normal limits             Vagina: No gross lesions or discharge  Cervix: No gross lesions or discharge.  Pap reflex done.  Uterus  AV, normal size, shape and consistency, non-tender and mobile  Adnexa  Without masses or tenderness  Anus and perineum  normal    Assessment/Plan:  56 y.o. female for annual exam   1. Encounter for routine gynecological examination with Papanicolaou smear of cervix Normal gynecologic exam.  Pap reflex done.  Breast exam normal.  Will schedule screening mammogram.  Will  call gastro to verify when next colonoscopy is due.  Health labs with family physician.  2. Menopause present Well on no hormone replacement therapy.  No postmenopausal bleeding.  3. Screening for osteoporosis Vitamin D supplements, calcium rich nutrition and regular weightbearing physical activity recommended.  Follow-up here for bone density. - DG Bone Density; Future  Princess Bruins MD, 10:52 AM  02/08/2017

## 2017-02-10 ENCOUNTER — Encounter: Payer: Self-pay | Admitting: Obstetrics & Gynecology

## 2017-02-10 NOTE — Patient Instructions (Signed)
1. Encounter for routine gynecological examination with Papanicolaou smear of cervix Normal gynecologic exam.  Pap reflex done.  Breast exam normal.  Will schedule screening mammogram.  Will call gastro to verify when next colonoscopy is due.  Health labs with family physician.  2. Menopause present Well on no hormone replacement therapy.  No postmenopausal bleeding.  3. Screening for osteoporosis Vitamin D supplements, calcium rich nutrition and regular weightbearing physical activity recommended.  Follow-up here for bone density. - DG Bone Density; Future  Gwendolyn Thompson, it was a pleasure seeing you today!  I will inform you of your results as soon as they are available.   Health Maintenance for Postmenopausal Women Menopause is a normal process in which your reproductive ability comes to an end. This process happens gradually over a span of months to years, usually between the ages of 52 and 66. Menopause is complete when you have missed 12 consecutive menstrual periods. It is important to talk with your health care provider about some of the most common conditions that affect postmenopausal women, such as heart disease, cancer, and bone loss (osteoporosis). Adopting a healthy lifestyle and getting preventive care can help to promote your health and wellness. Those actions can also lower your chances of developing some of these common conditions. What should I know about menopause? During menopause, you may experience a number of symptoms, such as:  Moderate-to-severe hot flashes.  Night sweats.  Decrease in sex drive.  Mood swings.  Headaches.  Tiredness.  Irritability.  Memory problems.  Insomnia.  Choosing to treat or not to treat menopausal changes is an individual decision that you make with your health care provider. What should I know about hormone replacement therapy and supplements? Hormone therapy products are effective for treating symptoms that are associated with  menopause, such as hot flashes and night sweats. Hormone replacement carries certain risks, especially as you become older. If you are thinking about using estrogen or estrogen with progestin treatments, discuss the benefits and risks with your health care provider. What should I know about heart disease and stroke? Heart disease, heart attack, and stroke become more likely as you age. This may be due, in part, to the hormonal changes that your body experiences during menopause. These can affect how your body processes dietary fats, triglycerides, and cholesterol. Heart attack and stroke are both medical emergencies. There are many things that you can do to help prevent heart disease and stroke:  Have your blood pressure checked at least every 1-2 years. High blood pressure causes heart disease and increases the risk of stroke.  If you are 77-18 years old, ask your health care provider if you should take aspirin to prevent a heart attack or a stroke.  Do not use any tobacco products, including cigarettes, chewing tobacco, or electronic cigarettes. If you need help quitting, ask your health care provider.  It is important to eat a healthy diet and maintain a healthy weight. ? Be sure to include plenty of vegetables, fruits, low-fat dairy products, and lean protein. ? Avoid eating foods that are high in solid fats, added sugars, or salt (sodium).  Get regular exercise. This is one of the most important things that you can do for your health. ? Try to exercise for at least 150 minutes each week. The type of exercise that you do should increase your heart rate and make you sweat. This is known as moderate-intensity exercise. ? Try to do strengthening exercises at least twice each week. Do  these in addition to the moderate-intensity exercise.  Know your numbers.Ask your health care provider to check your cholesterol and your blood glucose. Continue to have your blood tested as directed by your health  care provider.  What should I know about cancer screening? There are several types of cancer. Take the following steps to reduce your risk and to catch any cancer development as early as possible. Breast Cancer  Practice breast self-awareness. ? This means understanding how your breasts normally appear and feel. ? It also means doing regular breast self-exams. Let your health care provider know about any changes, no matter how small.  If you are 28 or older, have a clinician do a breast exam (clinical breast exam or CBE) every year. Depending on your age, family history, and medical history, it may be recommended that you also have a yearly breast X-ray (mammogram).  If you have a family history of breast cancer, talk with your health care provider about genetic screening.  If you are at high risk for breast cancer, talk with your health care provider about having an MRI and a mammogram every year.  Breast cancer (BRCA) gene test is recommended for women who have family members with BRCA-related cancers. Results of the assessment will determine the need for genetic counseling and BRCA1 and for BRCA2 testing. BRCA-related cancers include these types: ? Breast. This occurs in males or females. ? Ovarian. ? Tubal. This may also be called fallopian tube cancer. ? Cancer of the abdominal or pelvic lining (peritoneal cancer). ? Prostate. ? Pancreatic.  Cervical, Uterine, and Ovarian Cancer Your health care provider may recommend that you be screened regularly for cancer of the pelvic organs. These include your ovaries, uterus, and vagina. This screening involves a pelvic exam, which includes checking for microscopic changes to the surface of your cervix (Pap test).  For women ages 21-65, health care providers may recommend a pelvic exam and a Pap test every three years. For women ages 5-65, they may recommend the Pap test and pelvic exam, combined with testing for human papilloma virus (HPV),  every five years. Some types of HPV increase your risk of cervical cancer. Testing for HPV may also be done on women of any age who have unclear Pap test results.  Other health care providers may not recommend any screening for nonpregnant women who are considered low risk for pelvic cancer and have no symptoms. Ask your health care provider if a screening pelvic exam is right for you.  If you have had past treatment for cervical cancer or a condition that could lead to cancer, you need Pap tests and screening for cancer for at least 20 years after your treatment. If Pap tests have been discontinued for you, your risk factors (such as having a new sexual partner) need to be reassessed to determine if you should start having screenings again. Some women have medical problems that increase the chance of getting cervical cancer. In these cases, your health care provider may recommend that you have screening and Pap tests more often.  If you have a family history of uterine cancer or ovarian cancer, talk with your health care provider about genetic screening.  If you have vaginal bleeding after reaching menopause, tell your health care provider.  There are currently no reliable tests available to screen for ovarian cancer.  Lung Cancer Lung cancer screening is recommended for adults 17-91 years old who are at high risk for lung cancer because of a history of  smoking. A yearly low-dose CT scan of the lungs is recommended if you:  Currently smoke.  Have a history of at least 30 pack-years of smoking and you currently smoke or have quit within the past 15 years. A pack-year is smoking an average of one pack of cigarettes per day for one year.  Yearly screening should:  Continue until it has been 15 years since you quit.  Stop if you develop a health problem that would prevent you from having lung cancer treatment.  Colorectal Cancer  This type of cancer can be detected and can often be  prevented.  Routine colorectal cancer screening usually begins at age 29 and continues through age 60.  If you have risk factors for colon cancer, your health care provider may recommend that you be screened at an earlier age.  If you have a family history of colorectal cancer, talk with your health care provider about genetic screening.  Your health care provider may also recommend using home test kits to check for hidden blood in your stool.  A small camera at the end of a tube can be used to examine your colon directly (sigmoidoscopy or colonoscopy). This is done to check for the earliest forms of colorectal cancer.  Direct examination of the colon should be repeated every 5-10 years until age 35. However, if early forms of precancerous polyps or small growths are found or if you have a family history or genetic risk for colorectal cancer, you may need to be screened more often.  Skin Cancer  Check your skin from head to toe regularly.  Monitor any moles. Be sure to tell your health care provider: ? About any new moles or changes in moles, especially if there is a change in a mole's shape or color. ? If you have a mole that is larger than the size of a pencil eraser.  If any of your family members has a history of skin cancer, especially at a young age, talk with your health care provider about genetic screening.  Always use sunscreen. Apply sunscreen liberally and repeatedly throughout the day.  Whenever you are outside, protect yourself by wearing long sleeves, pants, a wide-brimmed hat, and sunglasses.  What should I know about osteoporosis? Osteoporosis is a condition in which bone destruction happens more quickly than new bone creation. After menopause, you may be at an increased risk for osteoporosis. To help prevent osteoporosis or the bone fractures that can happen because of osteoporosis, the following is recommended:  If you are 52-49 years old, get at least 1,000 mg of  calcium and at least 600 mg of vitamin D per day.  If you are older than age 79 but younger than age 82, get at least 1,200 mg of calcium and at least 600 mg of vitamin D per day.  If you are older than age 44, get at least 1,200 mg of calcium and at least 800 mg of vitamin D per day.  Smoking and excessive alcohol intake increase the risk of osteoporosis. Eat foods that are rich in calcium and vitamin D, and do weight-bearing exercises several times each week as directed by your health care provider. What should I know about how menopause affects my mental health? Depression may occur at any age, but it is more common as you become older. Common symptoms of depression include:  Low or sad mood.  Changes in sleep patterns.  Changes in appetite or eating patterns.  Feeling an overall lack of  motivation or enjoyment of activities that you previously enjoyed.  Frequent crying spells.  Talk with your health care provider if you think that you are experiencing depression. What should I know about immunizations? It is important that you get and maintain your immunizations. These include:  Tetanus, diphtheria, and pertussis (Tdap) booster vaccine.  Influenza every year before the flu season begins.  Pneumonia vaccine.  Shingles vaccine.  Your health care provider may also recommend other immunizations. This information is not intended to replace advice given to you by your health care provider. Make sure you discuss any questions you have with your health care provider. Document Released: 03/24/2005 Document Revised: 08/20/2015 Document Reviewed: 11/03/2014 Elsevier Interactive Patient Education  2018 [employer].

## 2017-02-12 LAB — PAP IG W/ RFLX HPV ASCU

## 2017-02-21 ENCOUNTER — Encounter: Payer: Self-pay | Admitting: *Deleted

## 2017-04-04 ENCOUNTER — Other Ambulatory Visit: Payer: Self-pay | Admitting: Gynecology

## 2017-04-09 ENCOUNTER — Other Ambulatory Visit: Payer: Self-pay | Admitting: Gynecology

## 2017-04-09 DIAGNOSIS — Z13820 Encounter for screening for osteoporosis: Secondary | ICD-10-CM

## 2017-04-17 ENCOUNTER — Encounter: Payer: Self-pay | Admitting: Gynecology

## 2017-04-17 ENCOUNTER — Ambulatory Visit (INDEPENDENT_AMBULATORY_CARE_PROVIDER_SITE_OTHER): Payer: BLUE CROSS/BLUE SHIELD

## 2017-04-17 DIAGNOSIS — Z13820 Encounter for screening for osteoporosis: Secondary | ICD-10-CM | POA: Diagnosis not present

## 2018-02-11 ENCOUNTER — Encounter: Payer: Self-pay | Admitting: Obstetrics & Gynecology

## 2018-02-11 ENCOUNTER — Ambulatory Visit (INDEPENDENT_AMBULATORY_CARE_PROVIDER_SITE_OTHER): Payer: BLUE CROSS/BLUE SHIELD | Admitting: Obstetrics & Gynecology

## 2018-02-11 VITALS — BP 140/88 | Ht 61.0 in | Wt 142.2 lb

## 2018-02-11 DIAGNOSIS — E663 Overweight: Secondary | ICD-10-CM

## 2018-02-11 DIAGNOSIS — Z01419 Encounter for gynecological examination (general) (routine) without abnormal findings: Secondary | ICD-10-CM | POA: Diagnosis not present

## 2018-02-11 DIAGNOSIS — Z780 Asymptomatic menopausal state: Secondary | ICD-10-CM

## 2018-02-11 NOTE — Patient Instructions (Signed)
1. Well female exam with routine gynecological exam Normal gynecologic exam and menopause.  Pap test negative in December 2018.  No indication to repeat this year.  Breast exam normal.  Will schedule screening mammogram at the breast center now.  Will call gastroenterologist as she probably needs a colonoscopy now at 6 years given the excision of a polyp in 2014.  Health labs with family physician.  Evaluation of a bilateral fullness at the supraclavicular area/base of neck.  2. Postmenopausal Well on no HRT.  No PMB.  Vitamin D supplement, Ca++ intake of 1.5 g/day, regular weightbearing physical activity.  3. Overweight (BMI 25.0-29.9) Recommend going back to a lower calorie/carb diet.  Aerobic physical activity 5x per week and weightlifting every 2 days.  Other orders - chlorthalidone (HYGROTON) 25 MG tablet; Take 25 mg by mouth daily. - atorvastatin (LIPITOR) 40 MG tablet; Take 40 mg by mouth daily.  Milca, it was a pleasure seeing you today!

## 2018-02-11 NOTE — Progress Notes (Signed)
Gwendolyn Thompson [birth year] [id number]   History:    57 y.o. G0 Same sex wife  RP:  Established patient presenting for annual gyn exam   HPI: Menopause, well on no hormone replacement therapy.  No postmenopausal bleeding.  No pelvic pain.  Urine and bowel movements normal.  Breasts normal.  Body mass index 26.87.  Not exercising on a regular basis.  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  Health labs with family physician.  Colonoscopy July 2014, polyp removed.  Past medical history,surgical history, family history and social history were all reviewed and documented in the EPIC chart.  Gynecologic History No LMP recorded. Patient is postmenopausal. Contraception: post menopausal status Last Pap: 01/2017. Results were: Negative Last mammogram: 10/2015. Results were: Negative Bone Density: Never Colonoscopy: 08/2012 Polyp removed.  Will call to schedule per Gastro.  Obstetric History OB History  Gravida Para Term Preterm AB Living  0            SAB TAB Ectopic Multiple Live Births                ROS: A ROS was performed and pertinent positives and negatives are included in the history.  GENERAL: No fevers or chills. HEENT: No change in vision, no earache, sore throat or sinus congestion. NECK: No pain or stiffness. CARDIOVASCULAR: No chest pain or pressure. No palpitations. PULMONARY: No shortness of breath, cough or wheeze. GASTROINTESTINAL: No abdominal pain, nausea, vomiting or diarrhea, melena or bright red blood per rectum. GENITOURINARY: No urinary frequency, urgency, hesitancy or dysuria. MUSCULOSKELETAL: No joint or muscle pain, no back pain, no recent trauma. DERMATOLOGIC: No rash, no itching, no lesions. ENDOCRINE: No polyuria, polydipsia, no heat or cold intolerance. No recent change in weight. HEMATOLOGICAL: No anemia or easy bruising or bleeding. NEUROLOGIC: No headache, seizures, numbness, tingling or weakness. PSYCHIATRIC: No depression, no loss of  interest in normal activity or change in sleep pattern.     Exam:   BP 140/88   Ht 5\' 1"  (1.549 m)   Wt 142 lb 3.2 oz (64.5 kg)   BMI 26.87 kg/m   Body mass index is 26.87 kg/m.  General appearance : Well developed well nourished female. No acute distress HEENT: Eyes: no retinal hemorrhage or exudates,  Neck supple, trachea midline, no carotid bruits, no thyroidmegaly.  Fullness bilaterally at supraclavicular area/lower neck.  Will schedule with Fam MD to evaluate. Lungs: Clear to auscultation, no rhonchi or wheezes, or rib retractions  Heart: Regular rate and rhythm, no murmurs or gallops Breast:Examined in sitting and supine position were symmetrical in appearance, no palpable masses or tenderness,  no skin retraction, no nipple inversion, no nipple discharge, no skin discoloration, no axillary or supraclavicular lymphadenopathy Abdomen: no palpable masses or tenderness, no rebound or guarding Extremities: no edema or skin discoloration or tenderness  Pelvic: Vulva: Normal             Vagina: No gross lesions or discharge  Cervix: No gross lesions or discharge  Uterus  AV, normal size, shape and consistency, non-tender and mobile  Adnexa  Without masses or tenderness  Anus: Normal   Assessment/Plan:  57 y.o. female for annual exam   1. Well female exam with routine gynecological exam Normal gynecologic exam and menopause.  Pap test negative in December 2018.  No indication to repeat this year.  Breast exam normal.  Will schedule screening mammogram at the breast center now.  Will call gastroenterologist as  she probably needs a colonoscopy now at 6 years given the excision of a polyp in 2014.  Health labs with family physician.  Evaluation of a bilateral fullness at the supraclavicular area/base of neck.  2. Postmenopausal Well on no HRT.  No PMB.  Vitamin D supplement, Ca++ intake of 1.5 g/day, regular weightbearing physical activity.  3. Overweight (BMI 25.0-29.9) Recommend  going back to a lower calorie/carb diet.  Aerobic physical activity 5x per week and weightlifting every 2 days.  Other orders - chlorthalidone (HYGROTON) 25 MG tablet; Take 25 mg by mouth daily. - atorvastatin (LIPITOR) 40 MG tablet; Take 40 mg by mouth daily.  Princess Bruins MD, 2:15 PM 02/11/2018

## 2018-02-12 ENCOUNTER — Other Ambulatory Visit: Payer: Self-pay | Admitting: Obstetrics & Gynecology

## 2018-02-12 DIAGNOSIS — Z1231 Encounter for screening mammogram for malignant neoplasm of breast: Secondary | ICD-10-CM

## 2018-02-14 ENCOUNTER — Ambulatory Visit
Admission: RE | Admit: 2018-02-14 | Discharge: 2018-02-14 | Disposition: A | Discharge status: Home / Self Care | Payer: BLUE CROSS/BLUE SHIELD | Source: Ambulatory Visit | Attending: Obstetrics & Gynecology | Admitting: Obstetrics & Gynecology

## 2018-02-14 DIAGNOSIS — Z1231 Encounter for screening mammogram for malignant neoplasm of breast: Secondary | ICD-10-CM

## 2018-08-21 ENCOUNTER — Other Ambulatory Visit: Payer: Self-pay

## 2018-08-21 ENCOUNTER — Ambulatory Visit (INDEPENDENT_AMBULATORY_CARE_PROVIDER_SITE_OTHER): Payer: BC Managed Care – PPO | Admitting: Cardiology

## 2018-08-21 ENCOUNTER — Encounter: Payer: Self-pay | Admitting: Cardiology

## 2018-08-21 VITALS — BP 163/94 | HR 89 | Ht 62.0 in | Wt 140.5 lb

## 2018-08-21 DIAGNOSIS — R011 Cardiac murmur, unspecified: Secondary | ICD-10-CM

## 2018-08-21 DIAGNOSIS — I10 Essential (primary) hypertension: Secondary | ICD-10-CM

## 2018-08-21 DIAGNOSIS — R9431 Abnormal electrocardiogram [ECG] [EKG]: Secondary | ICD-10-CM

## 2018-08-21 DIAGNOSIS — I471 Supraventricular tachycardia: Secondary | ICD-10-CM

## 2018-08-21 DIAGNOSIS — E7800 Pure hypercholesterolemia, unspecified: Secondary | ICD-10-CM | POA: Diagnosis not present

## 2018-08-21 DIAGNOSIS — I480 Paroxysmal atrial fibrillation: Secondary | ICD-10-CM

## 2018-08-21 MED ORDER — LISINOPRIL-HYDROCHLOROTHIAZIDE 10-12.5 MG PO TABS: 1.0000 | ORAL_TABLET | Freq: Every day | ORAL | 1 refills | Status: DC

## 2018-08-21 MED ORDER — DILTIAZEM HCL ER COATED BEADS 360 MG PO CP24: 360.0000 mg | ORAL_CAPSULE | Freq: Every day | ORAL | 1 refills | Status: DC

## 2018-08-21 NOTE — Progress Notes (Signed)
Primary Physician:  Larene Beach, MD   Patient ID: Gwendolyn Thompson, female    DOB: [birth year], 58 y.o.   MRN: [id number]  Subjective:    Chief Complaint  Patient presents with  . New Patient (Initial Visit)  . Atrial Fibrillation    HPI: Gwendolyn Thompson  is a 58 y.o. female  With hypertension, hyperlipidemia, history of basal cell carcinoma, referred to [place] by Dr. Loa Socks for management of newly noted A fib.  She had previously seen [place] in 2013 for difficulty to control hypertension. Patient had recently noted palpitations with heart racing and elevated blood pressure. She was placed on 7 day ziopatch with her PCP who noted A fib. She has now been started on [place]. She had noticed anxious feeling with A fib that she attributed to stress, but otherwise asymptomatic. Heart racing has improved since being on diltiazem, but does continue to vary on occasion. No exertional chest pain or shortness of breath. She has not had any bleeding complications since being on anticoagulation.   Blood pressure has recently again been elevated. She was previously on Chlorthalidone and amlodipine, but was changed recently. Reports that she has difficulty sleeping at night and states that she wakes up several times at night. She is not aware of any snoring. No history of diabetes or thyroid disorders. She is on lipitor for hyperlipidemia that she states is well controlled.   She does exercise regularly with walking that she tolerates well. She is a former smoker that quit over 30 years ago. Occasional alcohol use. No illicit drug use.   Past Medical History:  Diagnosis Date  . Anxious depression   . B12 deficiency-borderline 09/17/2013   Level was 210, July 2015  . Cancer ([place])    basal cell removed from nose  . Cervical dysplasia   . Depression   . Esophageal reflux   . HTN (hypertension)   . Hyperlipidemia   . Hyperplastic colon polyp   . Palpitations     Past Surgical  History:  Procedure Laterality Date  . COLONOSCOPY W/ BIOPSIES  08-26-2012   Dr. Glennon Hamilton   . COLPOSCOPY    . GYNECOLOGIC CRYOSURGERY  1987  . MOUTH SURGERY      Social History   Socioeconomic History  . Marital status: Married    Spouse name: Not on file  . Number of children: 0  . Years of education: Not on file  . Highest education level: Not on file  Occupational History  . Occupation: tax Optometrist  Social Needs  . Financial resource strain: Not on file  . Food insecurity    Worry: Not on file    Inability: Not on file  . Transportation needs    Medical: Not on file    Non-medical: Not on file  Tobacco Use  . Smoking status: Former Smoker    Packs/day: 0.25    Years: 3.00    Pack years: 0.75    Types: Cigarettes    Quit date: 08/20/1988    Years since quitting: 30.0  . Smokeless tobacco: Never Used  Substance and Sexual Activity  . Alcohol use: Yes    Alcohol/week: 2.0 standard drinks    Types: 2 Standard drinks or equivalent per week    Comment: 4-6 DRINKS A WEEK- WINE  . Drug use: No  . Sexual activity: Not Currently    Birth control/protection: Post-menopausal    Comment: 1ST intercourse- 19, partners- 69, married- 72 yrs  Lifestyle  . Physical activity    Days per week: Not on file    Minutes per session: Not on file  . Stress: Not on file  Relationships  . Social Herbalist on phone: Not on file    Gets together: Not on file    Attends religious service: Not on file    Active member of club or organization: Not on file    Attends meetings of clubs or organizations: Not on file    Relationship status: Not on file  . Intimate partner violence    Fear of current or ex partner: Not on file    Emotionally abused: Not on file    Physically abused: Not on file    Forced sexual activity: Not on file  Other Topics Concern  . Not on file  Social History Narrative   Lives w/ female partner, no children   Tax Optometrist   2 caffeine beverages  daily    Review of Systems  Constitution: Negative for decreased appetite, malaise/fatigue, weight gain and weight loss.  Eyes: Negative for visual disturbance.  Cardiovascular: Positive for palpitations. Negative for chest pain, claudication, dyspnea on exertion, leg swelling, orthopnea and syncope.  Respiratory: Negative for hemoptysis and wheezing.   Endocrine: Negative for cold intolerance and heat intolerance.  Hematologic/Lymphatic: Negative for bleeding problem. Does not bruise/bleed easily.  Skin: Negative for nail changes.  Musculoskeletal: Negative for muscle weakness and myalgias.  Gastrointestinal: Negative for abdominal pain, change in bowel habit, nausea and vomiting.  Neurological: Negative for difficulty with concentration, dizziness, focal weakness and headaches.  Psychiatric/Behavioral: Negative for altered mental status and suicidal ideas. The patient is nervous/anxious.   All other systems reviewed and are negative.     Objective:  Blood pressure (!) 163/94, pulse 89, height '5\' 2"'  (1.575 m), weight 140 lb 8 oz (63.7 kg), SpO2 96 %. Body mass index is 25.7 kg/m.    Physical Exam  Constitutional: She is oriented to person, place, and time. Vital signs are normal. She appears well-developed and well-nourished.  HENT:  Head: Normocephalic and atraumatic.  Neck: Normal range of motion.  Cardiovascular: Normal rate, regular rhythm and intact distal pulses.  Murmur heard.  Early systolic murmur is present at the upper right sternal border and apex. Pulmonary/Chest: Effort normal and breath sounds normal. No accessory muscle usage. No respiratory distress.  Abdominal: Soft. Bowel sounds are normal.  Musculoskeletal: Normal range of motion.  Neurological: She is alert and oriented to person, place, and time.  Skin: Skin is warm and dry.  Vitals reviewed.  Radiology: No results found.  Laboratory examination:   07/24/2018: Potassium 3.2, creatinine 0.6, EGFR greater  than 90, AST 51, ALT normal at 45 (previously AST 74 and ALT 66 in January 2020).  TSH 1.2.  Normal H&H, MCH 33.1, CBC otherwise normal.  02/21/2018: Cholesterol 287, triglycerides 239, HDL 79, LDL 183.  CMP Latest Ref Rng & Units 04/14/2010  Glucose 70 - 99 mg/dL 96  BUN 6 - 23 mg/dL 12  Creatinine 0.4 - 1.2 mg/dL 0.73  Sodium 135 - 145 mEq/L 134(L)  Potassium 3.5 - 5.1 mEq/L 3.6  Chloride 96 - 112 mEq/L 94(L)  CO2 19 - 32 mEq/L 27  Calcium 8.4 - 10.5 mg/dL 9.8  Total Protein 6.0 - 8.3 g/dL 7.6  Total Bilirubin 0.3 - 1.2 mg/dL 0.4  Alkaline Phos 39 - 117 U/L 57  AST 0 - 37 U/L 28  ALT 0 - 35  U/L 19   CBC Latest Ref Rng & Units 04/14/2010  WBC 4.0 - 10.5 K/uL 6.6  Hemoglobin 12.0 - 15.0 g/dL 13.2  Hematocrit 36.0 - 46.0 % 37.1  Platelets 150 - 400 K/uL 268   Lipid Panel  No results found for: CHOL, TRIG, HDL, CHOLHDL, VLDL, LDLCALC, LDLDIRECT HEMOGLOBIN A1C No results found for: HGBA1C, MPG TSH No results for input(s): TSH in the last 8760 hours.  PRN Meds:. Medications Discontinued During This Encounter  Medication Reason  . amLODipine (NORVASC) 5 MG tablet Change in therapy  . Ascorbic Acid (VITAMIN C PO) Patient Preference  . aspirin 81 MG tablet Discontinued by provider  . chlorthalidone (HYGROTON) 25 MG tablet Discontinued by provider  . Cyanocobalamin (VITAMIN B-12 PO) Discontinued by provider   Current Meds  Medication Sig  . ALPRAZolam (XANAX) 0.5 MG tablet Take by mouth as needed.  . AMBULATORY NON FORMULARY MEDICATION 500 mg. AHCC Immune Booster  2 tablets at breakfast   . AMBULATORY NON FORMULARY MEDICATION ALJ Herbal Respiratory Booster 4 tablets at breakfast  . atorvastatin (LIPITOR) 40 MG tablet Take 40 mg by mouth daily.  . Calcium Carbonate Antacid 600 MG chewable tablet Chew 1 tablet by mouth as needed for indigestion or heartburn.   . Calcium Carbonate-Vitamin D (CALCIUM + D PO) Take by mouth. 67mg qd  . Cetirizine HCl (ZYRTEC ALLERGY PO) Take by mouth.   . Coenzyme Q10 (CO Q 10 PO) Take by mouth.  . diltiazem (CARDIZEM CD) 180 MG 24 hr capsule Take by mouth daily.  . fluticasone (FLONASE) 50 MCG/ACT nasal spray INHALE 1 SPRAY BY NASAL ROUTE DAILY.  . hydrochlorothiazide (HYDRODIURIL) 25 MG tablet TAKE 1 TABLET BY MOUTH EVERY DAY  . rivaroxaban (XARELTO) 20 MG TABS tablet Take by mouth daily.  . TURMERIC CURCUMIN PO Take 500 mg by mouth 2 (two) times a day.  . Venlafaxine HCl 75 MG TB24 Take 1 capsule by mouth every evening.     Cardiac Studies:   4 day Ziopatch 07/31/2018: Dominant rhythm, Normal sinus rhythm. Episodes of brief atrial fibrillation with longest episode being on 06/17 at 1255 for 2 m 56s 112-182 bpm. Some episodes reported as A fib are questionable for Atrial tachycardia (06/19 and 06/18). A fib burden <1%. Patient triggered events for fluttering/racing correlated with A fib. 31 reported episodes of atrial tachycardia with fastest HR 204 bpm on 06/17 at 1437. Avg HR 147 bpm. Longest episode for 8 m on 06/16 at 1505. Rare PAC and PVC with 1 episode ventricular bigeminy for 3.4 s on 06/19.   Renal duplex 08/20/11: No renal artery stenosis. Normal kidney size bilateral.  Echo 08/21/11: Normal echocardiogram with trace MR, TR.  Lexiscan stress 09/08/11: EKG rest non specific lat ST depression. Stress nuclear perfusion normal.  Assessment:     ICD-10-CM   1. Paroxysmal atrial fibrillation (HCC)  I48.0 EKG 12-Lead    PCV ECHOCARDIOGRAM COMPLETE    PCV MYOCARDIAL PERFUSION WITH LEXISCAN  2. Atrial tachycardia (HCC)  I47.1   3. Essential hypertension  I10 PCV ECHOCARDIOGRAM COMPLETE  4. Pure hypercholesterolemia  E78.00   5. Abnormal EKG  R94.31   6. Systolic murmur  RH06.2   CHA2DS2-VASCScore: Risk Score  2,  Yearly risk of stroke  2.2%. Recommendation: Anticoagulation    EKG 08/21/2018: Normal sinus rhythm at 81 bpm, normal axis, LVH, T wave inversion in anterolateral leads, cannot exclude ischemia. Abnormal EKG. Compared to  EKG in 2013, T wave inversion in lateral  leads is new.  Recommendations:   Ms. Kanoe Wanner is a pleasant 58 year old female last seen by [place] in 2013 for uncontrolled hypertension.  She has past medical history of difficult to control hypertension, hyperlipidemia, recently developed symptoms of palpitations and was found to have episodes of atrial fibrillation on event monitor was referred to [place] for further evaluation.  She has been started on [place] and Xarelto by her PCP and symptoms of anxiousness and racing heartbeat have significantly improved with this.  She does continue to have occasional variation in her heart rate.  I reviewed monitor report, she reportedly had 4 (it appears 1 episode was counted twice) episodes of A. fib of brief duration with longest episode being 2 minutes.  On reviewing her report, a couple of the episodes reported as A. fib are questionable for atrial tachycardia.  She had several episodes of atrial tachycardia as well on event monitoring. Explanation of both arrhythmias was provided as well as treatment options. Would recommend increasing her diltiazem to 360 mg daily.  Could consider repeating event monitoring to see if she continues to have any paroxysmal episodes.  She will need ischemic work-up given new onset A fib.  Will obtain Lexiscan nuclear stress testing.  Has abnormal EKG with T wave inversion in anterolateral leads, likely related to hypertension.  Anterior T wave inversion was also present in 2013.  She is walking 4 miles per day without any exertional chest pain or shortness of breath.  No clinical evidence of heart failure.  She does have systolic murmur on exam that is likely flow murmur related to hypertension.  Will obtain echocardiogram to exclude any structural abnormalities.   In view of CHADSVASC of 2 (female and HTN), anticoagulation is recommended given her yearly CVA risk of 2.2%. She is tolerating well without any reported bleeding and is without  history of major bleeding. Benefits/risk with anticoagulation were discussed and patient is willing to proceed with anticoagulation.   Her blood pressure is markedly elevated today, and by her home monitor has been elevated over the last several weeks. She does report medications were changed recently. I will discontinue chlorthalidone and HCT and start Lisinopril HCT. Hopefully with this addition and also increasing Diltiazem, blood pressure will improve. She was previously on Clonidine patch that worked well for her that we could consider if needed.  LDL is markedly elevated at 183, but previously was in the 200-300 range. She is currently on Lipitor. She will need aggressive lipid control. She may be a candidate for PCSK9 inhibitor. Will further discuss with her at her next follow up. I will see her back in 4 weeks for follow up after the test for further recommendations and reevaluation.    *I have discussed this case with Dr. Einar Gip and he personally examined the patient and participated in formulating the plan.*    Miquel Dunn, MSN, APRN, FNP-C [facility]. [place] Office: [phone] Fax: [phone]

## 2018-08-22 NOTE — Telephone Encounter (Signed)
From pt

## 2018-08-26 NOTE — Telephone Encounter (Signed)
Please respond

## 2018-08-27 ENCOUNTER — Encounter: Payer: Self-pay | Admitting: Cardiology

## 2018-08-27 NOTE — Telephone Encounter (Signed)
Please respond

## 2018-09-05 ENCOUNTER — Ambulatory Visit (INDEPENDENT_AMBULATORY_CARE_PROVIDER_SITE_OTHER): Payer: BC Managed Care – PPO

## 2018-09-05 ENCOUNTER — Other Ambulatory Visit: Payer: Self-pay

## 2018-09-05 DIAGNOSIS — I10 Essential (primary) hypertension: Secondary | ICD-10-CM

## 2018-09-05 DIAGNOSIS — I480 Paroxysmal atrial fibrillation: Secondary | ICD-10-CM

## 2018-09-11 ENCOUNTER — Other Ambulatory Visit: Payer: Self-pay

## 2018-09-11 ENCOUNTER — Ambulatory Visit (INDEPENDENT_AMBULATORY_CARE_PROVIDER_SITE_OTHER): Payer: BC Managed Care – PPO

## 2018-09-11 DIAGNOSIS — I480 Paroxysmal atrial fibrillation: Secondary | ICD-10-CM | POA: Diagnosis not present

## 2018-09-13 ENCOUNTER — Other Ambulatory Visit: Payer: Self-pay | Admitting: Cardiology

## 2018-09-16 NOTE — Telephone Encounter (Signed)
Please read

## 2018-09-17 ENCOUNTER — Other Ambulatory Visit: Payer: Self-pay

## 2018-09-17 ENCOUNTER — Encounter: Payer: Self-pay | Admitting: Cardiology

## 2018-09-17 ENCOUNTER — Ambulatory Visit (INDEPENDENT_AMBULATORY_CARE_PROVIDER_SITE_OTHER): Payer: BC Managed Care – PPO | Admitting: Cardiology

## 2018-09-17 VITALS — BP 144/66 | HR 51 | Ht 61.0 in | Wt 140.4 lb

## 2018-09-17 DIAGNOSIS — I480 Paroxysmal atrial fibrillation: Secondary | ICD-10-CM | POA: Diagnosis not present

## 2018-09-17 DIAGNOSIS — E7800 Pure hypercholesterolemia, unspecified: Secondary | ICD-10-CM

## 2018-09-17 DIAGNOSIS — I4819 Other persistent atrial fibrillation: Secondary | ICD-10-CM | POA: Insufficient documentation

## 2018-09-17 DIAGNOSIS — I4891 Unspecified atrial fibrillation: Secondary | ICD-10-CM

## 2018-09-17 DIAGNOSIS — I471 Supraventricular tachycardia: Secondary | ICD-10-CM | POA: Diagnosis not present

## 2018-09-17 DIAGNOSIS — I10 Essential (primary) hypertension: Secondary | ICD-10-CM

## 2018-09-17 DIAGNOSIS — I340 Nonrheumatic mitral (valve) insufficiency: Secondary | ICD-10-CM

## 2018-09-17 HISTORY — DX: Unspecified atrial fibrillation: I48.91

## 2018-09-17 MED ORDER — LISINOPRIL-HYDROCHLOROTHIAZIDE 20-25 MG PO TABS: 1.0000 | ORAL_TABLET | Freq: Every day | ORAL | 1 refills | Status: DC

## 2018-09-17 MED ORDER — ATORVASTATIN CALCIUM 80 MG PO TABS: 80.0000 mg | ORAL_TABLET | Freq: Every day | ORAL | 3 refills | Status: DC

## 2018-09-17 NOTE — Progress Notes (Signed)
Primary Physician:  Larene Beach, MD   Patient ID: Gwendolyn Thompson, female    DOB: 1960-07-21, 58 y.o.   MRN: [id number]  Subjective:    Chief Complaint  Patient presents with  . Atrial Fibrillation  . Follow-up    HPI: Gwendolyn Thompson  is Gwendolyn 58 y.o. female  with hypertension, hyperlipidemia, history of basal cell carcinoma, with brief episodes of Gwendolyn fib and atrial tachycardia on 4 day monitor performed by PCP on 07/31/2018.  Patient had 4 brief episodes of Gwendolyn fib with longest episode lasting 2 mins and 31 episodes of atrial tachycardia that were also brief. Symptoms correlated with arrhythmias. She has history of difficult to control hypertension. At her last office visit, Gwendolyn Thompson was increased to 360 mg daily. Chlorthalidone was discontinued and she was started on Lisinopril HCT. She is on Xarelto and tolerating this well without any bleeding. She underwent echocardiogram and nuclear stress testing and now presents to discuss results.  Since last seen by [place], heart rate has remained overall stable and she is feeling much better. Continues to walk regularly and tolerates this well. Heart rate is also stable during this. She has not noticed any of the anxious feeling that she felt before.   No history of diabetes or thyroid disorders. She is on lipitor for hyperlipidemia that she states is well controlled.   She is Gwendolyn former smoker that quit over 30 years ago. Occasional alcohol use. No illicit drug use.   Past Medical History:  Diagnosis Date  . Anxious depression   . Atrial fibrillation ([place]) 09/17/2018  . B12 deficiency-borderline 09/17/2013   Level was 210, July 2015  . Cancer ([place])    basal cell removed from nose  . Cervical dysplasia   . Depression   . Esophageal reflux   . HTN (hypertension)   . Hyperlipidemia   . Hyperplastic colon polyp   . Palpitations     Past Surgical History:  Procedure Laterality Date  . COLONOSCOPY W/ BIOPSIES  08-26-2012   Dr. Glennon Hamilton   .  COLPOSCOPY    . GYNECOLOGIC CRYOSURGERY  1987  . MOUTH SURGERY      Social History   Socioeconomic History  . Marital status: Married    Spouse name: Not on file  . Number of children: 0  . Years of education: Not on file  . Highest education level: Not on file  Occupational History  . Occupation: tax Optometrist  Social Needs  . Financial resource strain: Not on file  . Food insecurity    Worry: Not on file    Inability: Not on file  . Transportation needs    Medical: Not on file    Non-medical: Not on file  Tobacco Use  . Smoking status: Former Smoker    Packs/day: 0.25    Years: 3.00    Pack years: 0.75    Types: Cigarettes    Quit date: 08/20/1988    Years since quitting: 30.0  . Smokeless tobacco: Never Used  Substance and Sexual Activity  . Alcohol use: Yes    Alcohol/week: 2.0 standard drinks    Types: 2 Standard drinks or equivalent per week    Comment: 4-6 DRINKS Gwendolyn WEEK- WINE  . Drug use: No  . Sexual activity: Not Currently    Birth control/protection: Post-menopausal    Comment: 1ST intercourse- 19, partners- 93, married- 23 yrs   Lifestyle  . Physical activity    Days per week: Not on file  Minutes per session: Not on file  . Stress: Not on file  Relationships  . Social Herbalist on phone: Not on file    Gets together: Not on file    Attends religious service: Not on file    Active member of club or organization: Not on file    Attends meetings of clubs or organizations: Not on file    Relationship status: Not on file  . Intimate partner violence    Fear of current or ex partner: Not on file    Emotionally abused: Not on file    Physically abused: Not on file    Forced sexual activity: Not on file  Other Topics Concern  . Not on file  Social History Narrative   Lives w/ female partner, no children   Tax Optometrist   2 caffeine beverages daily    Review of Systems  Constitution: Negative for decreased appetite, malaise/fatigue,  weight gain and weight loss.  Eyes: Negative for visual disturbance.  Cardiovascular: Positive for palpitations (improved). Negative for chest pain, claudication, dyspnea on exertion, leg swelling, orthopnea and syncope.  Respiratory: Negative for hemoptysis and wheezing.   Endocrine: Negative for cold intolerance and heat intolerance.  Hematologic/Lymphatic: Negative for bleeding problem. Does not bruise/bleed easily.  Skin: Negative for nail changes.  Musculoskeletal: Negative for muscle weakness and myalgias.  Gastrointestinal: Negative for abdominal pain, change in bowel habit, nausea and vomiting.  Neurological: Negative for difficulty with concentration, dizziness, focal weakness and headaches.  Psychiatric/Behavioral: Negative for altered mental status and suicidal ideas. The patient is not nervous/anxious.   All other systems reviewed and are negative.     Objective:  Blood pressure (!) 144/66, pulse (!) 51, height '5\' 1"'  (1.549 m), weight 140 lb 6.4 oz (63.7 kg), SpO2 99 %. Body mass index is 26.53 kg/m.    Physical Exam  Constitutional: She is oriented to person, place, and time. Vital signs are normal. She appears well-developed and well-nourished.  HENT:  Head: Normocephalic and atraumatic.  Neck: Normal range of motion.  Cardiovascular: Normal rate, regular rhythm and intact distal pulses.  Murmur heard.  Early systolic murmur is present at the upper right sternal border and apex. Pulmonary/Chest: Effort normal and breath sounds normal. No accessory muscle usage. No respiratory distress.  Abdominal: Soft. Bowel sounds are normal.  Musculoskeletal: Normal range of motion.  Neurological: She is alert and oriented to person, place, and time.  Skin: Skin is warm and dry.  Vitals reviewed.  Radiology: No results found.  Laboratory examination:   07/24/2018: Potassium 3.2, creatinine 0.6, EGFR greater than 90, AST 51, ALT normal at 45 (previously AST 74 and ALT 66 in  January 2020).  TSH 1.2.  Normal H&H, MCH 33.1, CBC otherwise normal.  02/21/2018: Cholesterol 287, triglycerides 239, HDL 79, LDL 183.  CMP Latest Ref Rng & Units 04/14/2010  Glucose 70 - 99 mg/dL 96  BUN 6 - 23 mg/dL 12  Creatinine 0.4 - 1.2 mg/dL 0.73  Sodium 135 - 145 mEq/L 134(L)  Potassium 3.5 - 5.1 mEq/L 3.6  Chloride 96 - 112 mEq/L 94(L)  CO2 19 - 32 mEq/L 27  Calcium 8.4 - 10.5 mg/dL 9.8  Total Protein 6.0 - 8.3 g/dL 7.6  Total Bilirubin 0.3 - 1.2 mg/dL 0.4  Alkaline Phos 39 - 117 U/L 57  AST 0 - 37 U/L 28  ALT 0 - 35 U/L 19   CBC Latest Ref Rng & Units 04/14/2010  WBC 4.0 -  10.5 K/uL 6.6  Hemoglobin 12.0 - 15.0 g/dL 13.2  Hematocrit 36.0 - 46.0 % 37.1  Platelets 150 - 400 K/uL 268   Lipid Panel  No results found for: CHOL, TRIG, HDL, CHOLHDL, VLDL, LDLCALC, LDLDIRECT HEMOGLOBIN A1C No results found for: HGBA1C, MPG TSH No results for input(s): TSH in the last 8760 hours.  PRN Meds:. Medications Discontinued During This Encounter  Medication Reason  . Calcium Carbonate-Vitamin D (CALCIUM + D PO) Error   Current Meds  Medication Sig  . ALPRAZolam (XANAX) 0.5 MG tablet Take by mouth as needed.  . AMBULATORY NON FORMULARY MEDICATION 500 mg. AHCC Immune Booster  2 tablets at breakfast   . AMBULATORY NON FORMULARY MEDICATION ALJ Herbal Respiratory Booster 4 tablets at breakfast  . atorvastatin (LIPITOR) 40 MG tablet Take 40 mg by mouth daily.  . Calcium Carbonate Antacid 600 MG chewable tablet Chew 1 tablet by mouth as needed for indigestion or heartburn.   . Cetirizine HCl (ZYRTEC ALLERGY PO) Take by mouth.  . Coenzyme Q10 (CO Q 10 PO) Take by mouth.  . diltiazem (CARDIZEM CD) 360 MG 24 hr capsule Take 1 capsule (360 mg total) by mouth daily.  . fluticasone (FLONASE) 50 MCG/ACT nasal spray INHALE 1 SPRAY BY NASAL ROUTE DAILY.  Marland Kitchen lisinopril-hydrochlorothiazide (ZESTORETIC) 10-12.5 MG tablet TAKE 1 TABLET BY MOUTH EVERY DAY  . rivaroxaban (XARELTO) 20 MG TABS tablet  Take by mouth daily.  . TURMERIC CURCUMIN PO Take 500 mg by mouth 2 (two) times Gwendolyn day.  . Venlafaxine HCl 75 MG TB24 Take 1 capsule by mouth every evening.     Cardiac Studies:   Echocardiogram 09/05/2018: Moderate concentric hypertrophy of the left ventricle. Left ventricle cavity is normal in size. Normal LV systolic function with EF 58%. Normal global wall motion. Normal diastolic filling pattern. Calculated EF 58%. Moderate (Grade II) mitral regurgitation. Inadequate TR jet to estimate pulmonary artery systolic pressure. Normal right atrial pressure.   Lexiscan Sestamibi Stress Test 09/11/2018: Resting EKG normal sinus rhythm, LVH, occasional PVCs.  Stress EKG nondiagnostic due to pharmacologic stress testing.  Hypertensive throughout the study with resting blood pressure 160/110 mmHg. Myocardial perfusion imaging is normal. Left ventricular ejection fraction is  74% with normal wall motion. Low risk study.  4 day Ziopatch 07/31/2018: Dominant rhythm, Normal sinus rhythm. Episodes of brief atrial fibrillation with longest episode being on 06/17 at 1255 for 2 m 56s 112-182 bpm. Some episodes reported as Gwendolyn fib are questionable for Atrial tachycardia (06/19 and 06/18). Gwendolyn fib burden <1%. Patient triggered events for fluttering/racing correlated with Gwendolyn fib. 31 reported episodes of atrial tachycardia with fastest HR 204 bpm on 06/17 at 1437. Avg HR 147 bpm. Longest episode for 8 m on 06/16 at 1505. Rare PAC and PVC with 1 episode ventricular bigeminy for 3.4 s on 06/19.   Renal duplex 08/20/11: No renal artery stenosis. Normal kidney size bilateral.   Assessment:     ICD-10-CM   1. Paroxysmal atrial fibrillation (HCC)  I48.0    CHA2DS2-VASCScore: Risk Score  2,  Yearly risk of stroke  2.2%. Recommendation: Anticoagulation    EKG 08/21/2018: Normal sinus rhythm at 81 bpm, normal axis, LVH, T wave inversion in anterolateral leads, cannot exclude ischemia. Abnormal EKG. Compared to EKG in 2013,  T wave inversion in lateral leads is new.  Recommendations:   Ms. Gwendolyn Thompson is Gwendolyn pleasant 58 year old female last seen by [place] in 2013 for uncontrolled hypertension.  She has past medical history  of difficult to control hypertension, hyperlipidemia, recently developed symptoms of palpitations and was found to have episodes of atrial fibrillation on event monitor was referred to [place] for further evaluation.   I have discussed recently obtained stress test, no evidence of ischemia. Considered low risk study. She was noted to be hypertensive throughout the study. Echocardiogram also shows moderate LVH, likely from uncontrolled hypertension and previous athlete. Normal LVEF. Does have moderate MR that will need continued monitoring. I have stressed the importance of adequate blood pressure control and suspect that this may have been contributing to her new onset Gwendolyn fib and atrial tachycardia. Will increase her Lisinopril HCT to 20-37m daily. Will obtain BMP in 10 days for surveillance.   In regards to PAF and atrial tachycardia, palpitations have improved and heart rate has been stable.Will continue to monitor. Could consider evaluation for ablation. Continue with anticoagulation.   She does have markedly elevated lipids that I suspect is familial. Lipids are improved from previous readings Gwendolyn few years prior. I will increase her lIpitor to 80 mg daily. She will likely also need addition of Zetia or PCSK9 inhibitor. Will obtain lipids in 8 weeks for follow up. I will see her back in 4-6 weeks for follow up on hypertension and PAF.   Gwendolyn Dunn MSN, APRN, FNP-C [facility]. PWoodstonOffice: [phone]: [phone]

## 2018-09-19 ENCOUNTER — Telehealth: Payer: Self-pay | Admitting: Cardiology

## 2018-09-19 ENCOUNTER — Ambulatory Visit: Payer: BC Managed Care – PPO | Admitting: Cardiology

## 2018-09-19 NOTE — Telephone Encounter (Signed)
Called patient to see if she was having any symptoms with high heart rate. IF she did, would recommend ER. If not could add metoprolol and see her tomorrow. She reports that prior to my call her heart rate finally dropped into the 90's. She states before with HR in the 170's felt "agitated" and worried, but not lightheaded or dizzy. She may need repeat monitor. Will see if we can see her tomorrow and decide.

## 2018-09-19 NOTE — Telephone Encounter (Signed)
From patient.

## 2018-09-19 NOTE — Telephone Encounter (Signed)
Please read

## 2018-09-20 ENCOUNTER — Ambulatory Visit: Payer: BC Managed Care – PPO | Admitting: Cardiology

## 2018-09-25 NOTE — Telephone Encounter (Signed)
Please read

## 2018-09-26 ENCOUNTER — Encounter: Payer: Self-pay | Admitting: Cardiology

## 2018-09-26 ENCOUNTER — Other Ambulatory Visit: Payer: Self-pay

## 2018-09-26 ENCOUNTER — Ambulatory Visit (INDEPENDENT_AMBULATORY_CARE_PROVIDER_SITE_OTHER): Payer: BC Managed Care – PPO | Admitting: Cardiology

## 2018-09-26 VITALS — BP 141/86 | HR 92 | Ht 62.0 in | Wt 138.9 lb

## 2018-09-26 DIAGNOSIS — R002 Palpitations: Secondary | ICD-10-CM | POA: Diagnosis not present

## 2018-09-26 DIAGNOSIS — I480 Paroxysmal atrial fibrillation: Secondary | ICD-10-CM | POA: Diagnosis not present

## 2018-09-26 DIAGNOSIS — I10 Essential (primary) hypertension: Secondary | ICD-10-CM

## 2018-09-26 DIAGNOSIS — I471 Supraventricular tachycardia: Secondary | ICD-10-CM

## 2018-09-26 MED ORDER — METOPROLOL TARTRATE 25 MG PO TABS: 25.0000 mg | ORAL_TABLET | Freq: Two times a day (BID) | ORAL | 1 refills | Status: DC

## 2018-09-26 NOTE — Telephone Encounter (Signed)
Please read

## 2018-09-26 NOTE — Progress Notes (Signed)
Primary Physician:  Larene Beach, MD   Patient ID: Gwendolyn Thompson, female    DOB: 01/27/1961, 58 y.o.   MRN: [id number]  Subjective:    Chief Complaint  Patient presents with  . Palpitations    elevated heart rate   . Follow-up    HPI: Gwendolyn Thompson  is a 58 y.o. female  with hypertension, hyperlipidemia, history of basal cell carcinoma, with brief episodes of A fib and atrial tachycardia on 4 day monitor performed by PCP on 07/31/2018.  Patient had 4 brief episodes of A fib with longest episode lasting 2 mins and 31 episodes of atrial tachycardia that were also brief. Symptoms correlated with arrhythmias. She has history of difficult to control hypertension. At her last office visit, Gwendolyn Thompson was increased to 360 mg daily. Chlorthalidone was discontinued and she was started on Lisinopril HCT. She is on Xarelto and tolerating this well without any bleeding. She underwent lexiscan nuclear stress test on 09/11/18 that was considered low risk study. Echo 09/05/18 showed moderate LVH, normal LVEF. Moderate MR. She had been doing well with increased dose of diltiazem.  She is here for acute visit today for increased variations in her heart rate. States that her heart rate can go from 80 to 150 in a few seconds. She has also had 2 episodes with high heart rate in the 160's that persisted for approximately 5 hours. Yesterday, had associated chest pressure and sweating. She is feeling well today. She has continued to walk and tolerates this well.    No history of diabetes or thyroid disorders. She is on lipitor for hyperlipidemia that she states is well controlled.   She is a former smoker that quit over 30 years ago. Occasional alcohol use. No illicit drug use.   Past Medical History:  Diagnosis Date  . Anxious depression   . Atrial fibrillation ([place]) 09/17/2018  . B12 deficiency-borderline 09/17/2013   Level was 210, July 2015  . Cancer ([place])    basal cell removed from nose  .  Cervical dysplasia   . Depression   . Esophageal reflux   . HTN (hypertension)   . Hyperlipidemia   . Hyperplastic colon polyp   . Palpitations     Past Surgical History:  Procedure Laterality Date  . COLONOSCOPY W/ BIOPSIES  08-26-2012   Dr. Glennon Hamilton   . COLPOSCOPY    . GYNECOLOGIC CRYOSURGERY  1987  . MOUTH SURGERY      Social History   Socioeconomic History  . Marital status: Married    Spouse name: Not on file  . Number of children: 0  . Years of education: Not on file  . Highest education level: Not on file  Occupational History  . Occupation: tax Optometrist  Social Needs  . Financial resource strain: Not on file  . Food insecurity    Worry: Not on file    Inability: Not on file  . Transportation needs    Medical: Not on file    Non-medical: Not on file  Tobacco Use  . Smoking status: Former Smoker    Packs/day: 0.25    Years: 3.00    Pack years: 0.75    Types: Cigarettes    Quit date: 08/20/1988    Years since quitting: 30.1  . Smokeless tobacco: Never Used  Substance and Sexual Activity  . Alcohol use: Yes    Alcohol/week: 2.0 standard drinks    Types: 2 Standard drinks or equivalent per week  Comment: 4-6 DRINKS A WEEK- WINE  . Drug use: No  . Sexual activity: Not Currently    Birth control/protection: Post-menopausal    Comment: 1ST intercourse- 19, partners- 65, married- 23 yrs   Lifestyle  . Physical activity    Days per week: Not on file    Minutes per session: Not on file  . Stress: Not on file  Relationships  . Social Herbalist on phone: Not on file    Gets together: Not on file    Attends religious service: Not on file    Active member of club or organization: Not on file    Attends meetings of clubs or organizations: Not on file    Relationship status: Not on file  . Intimate partner violence    Fear of current or ex partner: Not on file    Emotionally abused: Not on file    Physically abused: Not on file    Forced sexual  activity: Not on file  Other Topics Concern  . Not on file  Social History Narrative   Lives w/ female partner, no children   Tax Optometrist   2 caffeine beverages daily    Review of Systems  Constitution: Negative for decreased appetite, malaise/fatigue, weight gain and weight loss.  Eyes: Negative for visual disturbance.  Cardiovascular: Positive for palpitations. Negative for chest pain, claudication, dyspnea on exertion, leg swelling, orthopnea and syncope.  Respiratory: Negative for hemoptysis and wheezing.   Endocrine: Negative for cold intolerance and heat intolerance.  Hematologic/Lymphatic: Negative for bleeding problem. Does not bruise/bleed easily.  Skin: Negative for nail changes.  Musculoskeletal: Negative for muscle weakness and myalgias.  Gastrointestinal: Negative for abdominal pain, change in bowel habit, nausea and vomiting.  Neurological: Negative for difficulty with concentration, dizziness, focal weakness and headaches.  Psychiatric/Behavioral: Negative for altered mental status and suicidal ideas. The patient is not nervous/anxious.   All other systems reviewed and are negative.     Objective:  Blood pressure (!) 141/86, pulse 92, height '5\' 2"'  (1.575 m), weight 138 lb 14.4 oz (63 kg), SpO2 99 %. Body mass index is 25.41 kg/m.    Physical Exam  Constitutional: She is oriented to person, place, and time. Vital signs are normal. She appears well-developed and well-nourished.  HENT:  Head: Normocephalic and atraumatic.  Neck: Normal range of motion.  Cardiovascular: Normal rate, regular rhythm and intact distal pulses.  Murmur heard.  Early systolic murmur is present at the upper right sternal border and apex. Pulmonary/Chest: Effort normal and breath sounds normal. No accessory muscle usage. No respiratory distress.  Abdominal: Soft. Bowel sounds are normal.  Musculoskeletal: Normal range of motion.  Neurological: She is alert and oriented to person, place,  and time.  Skin: Skin is warm and dry.  Vitals reviewed.  Radiology: No results found.  Laboratory examination:   07/24/2018: Potassium 3.2, creatinine 0.6, EGFR greater than 90, AST 51, ALT normal at 45 (previously AST 74 and ALT 66 in January 2020).  TSH 1.2.  Normal H&H, MCH 33.1, CBC otherwise normal.  02/21/2018: Cholesterol 287, triglycerides 239, HDL 79, LDL 183.  CMP Latest Ref Rng & Units 04/14/2010  Glucose 70 - 99 mg/dL 96  BUN 6 - 23 mg/dL 12  Creatinine 0.4 - 1.2 mg/dL 0.73  Sodium 135 - 145 mEq/L 134(L)  Potassium 3.5 - 5.1 mEq/L 3.6  Chloride 96 - 112 mEq/L 94(L)  CO2 19 - 32 mEq/L 27  Calcium 8.4 - 10.5  mg/dL 9.8  Total Protein 6.0 - 8.3 g/dL 7.6  Total Bilirubin 0.3 - 1.2 mg/dL 0.4  Alkaline Phos 39 - 117 U/L 57  AST 0 - 37 U/L 28  ALT 0 - 35 U/L 19   CBC Latest Ref Rng & Units 04/14/2010  WBC 4.0 - 10.5 K/uL 6.6  Hemoglobin 12.0 - 15.0 g/dL 13.2  Hematocrit 36.0 - 46.0 % 37.1  Platelets 150 - 400 K/uL 268   Lipid Panel  No results found for: CHOL, TRIG, HDL, CHOLHDL, VLDL, LDLCALC, LDLDIRECT HEMOGLOBIN A1C No results found for: HGBA1C, MPG TSH No results for input(s): TSH in the last 8760 hours.  PRN Meds:. There are no discontinued medications. Current Meds  Medication Sig  . ALPRAZolam (XANAX) 0.5 MG tablet Take by mouth as needed.  . AMBULATORY NON FORMULARY MEDICATION 500 mg. AHCC Immune Booster  2 tablets at breakfast   . AMBULATORY NON FORMULARY MEDICATION ALJ Herbal Respiratory Booster 4 tablets at breakfast  . atorvastatin (LIPITOR) 80 MG tablet Take 1 tablet (80 mg total) by mouth daily.  . Calcium Carbonate Antacid 600 MG chewable tablet Chew 1 tablet by mouth as needed for indigestion or heartburn.   . Cetirizine HCl (ZYRTEC ALLERGY PO) Take by mouth.  . Cholecalciferol (VITAMIN D-3 PO) Take by mouth daily.  . Coenzyme Q10 (CO Q 10 PO) Take by mouth.  . diltiazem (CARDIZEM CD) 360 MG 24 hr capsule Take 1 capsule (360 mg total) by mouth  daily.  . fluticasone (FLONASE) 50 MCG/ACT nasal spray INHALE 1 SPRAY BY NASAL ROUTE DAILY.  Marland Kitchen lisinopril-hydrochlorothiazide (ZESTORETIC) 20-25 MG tablet Take 1 tablet by mouth daily.  . rivaroxaban (XARELTO) 20 MG TABS tablet Take by mouth daily.  . TURMERIC CURCUMIN PO Take 500 mg by mouth 2 (two) times a day.  . Venlafaxine HCl 75 MG TB24 Take 1 capsule by mouth every evening.     Cardiac Studies:   Echocardiogram 09/05/2018: Moderate concentric hypertrophy of the left ventricle. Left ventricle cavity is normal in size. Normal LV systolic function with EF 58%. Normal global wall motion. Normal diastolic filling pattern. Calculated EF 58%. Moderate (Grade II) mitral regurgitation. Inadequate TR jet to estimate pulmonary artery systolic pressure. Normal right atrial pressure.   Lexiscan Sestamibi Stress Test 09/11/2018: Resting EKG normal sinus rhythm, LVH, occasional PVCs.  Stress EKG nondiagnostic due to pharmacologic stress testing.  Hypertensive throughout the study with resting blood pressure 160/110 mmHg. Myocardial perfusion imaging is normal. Left ventricular ejection fraction is  74% with normal wall motion. Low risk study.  4 day Ziopatch 07/31/2018: Dominant rhythm, Normal sinus rhythm. Episodes of brief atrial fibrillation with longest episode being on 06/17 at 1255 for 2 m 56s 112-182 bpm. Some episodes reported as A fib are questionable for Atrial tachycardia (06/19 and 06/18). A fib burden <1%. Patient triggered events for fluttering/racing correlated with A fib. 31 reported episodes of atrial tachycardia with fastest HR 204 bpm on 06/17 at 1437. Avg HR 147 bpm. Longest episode for 8 m on 06/16 at 1505. Rare PAC and PVC with 1 episode ventricular bigeminy for 3.4 s on 06/19.   Renal duplex 08/20/11: No renal artery stenosis. Normal kidney size bilateral.   Assessment:     ICD-10-CM   1. Paroxysmal atrial fibrillation (HCC)  I48.0 EKG 12-Lead  2. Atrial tachycardia (HCC)   I47.1   3. Essential hypertension  I10    CHA2DS2-VASCScore: Risk Score  2,  Yearly risk of stroke  2.2%. Recommendation:  Anticoagulation    EKG 09/26/2018: Normal sinus rhythm at 81 bpm, normal axis, LVH, T wave inversion in anterolateral leads, cannot exclude ischemia. Abnormal EKG. No changes from EKG on 08/21/2018.  Recommendations:   Ms. Gwendolyn Thompson is a pleasant 58 year old female last seen by [place] in 2013 for uncontrolled hypertension.  She has past medical history of difficult to control hypertension, hyperlipidemia, recently developed symptoms of palpitations and was found to have episodes of atrial fibrillation on event monitor was referred to [place] for further evaluation.   Here for acute visit today, she has had increased frequency of racing and palpitations with 2 episodes of associated chest pressure and sweating. I will start her on Metoprolol succinate 25 mg daily for now, may need to uptitrate her dose. Suspect that her chest pressure was related to RVR. She has had negative nuclear stress test. EKG is without acute abnormalities. If she continues to have frequent episodes of atrial fibrillation or atrial tachycardia, can consider anti-arrhythmic therapy.   Blood pressure has significantly improved from before, but still slightly elevated. Hopefully, will continue to improve with addition of metoprolol. Will continue to monitor. She is scheduled to see me back on 09/16, will keep this appt, but if she needs to be seen sooner, can contact me.    Miquel Dunn, MSN, APRN, FNP-C [facility]. [place] Office: [phone] Fax: [phone]

## 2018-09-27 MED ORDER — DILTIAZEM HCL ER COATED BEADS 360 MG PO CP24: 360.0000 mg | ORAL_CAPSULE | Freq: Every day | ORAL | 1 refills | Status: DC

## 2018-09-27 NOTE — Telephone Encounter (Signed)
Please fill

## 2018-09-27 NOTE — Telephone Encounter (Signed)
Please read

## 2018-10-09 NOTE — Telephone Encounter (Signed)
Please read

## 2018-10-11 ENCOUNTER — Other Ambulatory Visit: Payer: Self-pay | Admitting: Cardiology

## 2018-10-14 NOTE — Telephone Encounter (Signed)
Please read

## 2018-10-20 ENCOUNTER — Other Ambulatory Visit: Payer: Self-pay | Admitting: Cardiology

## 2018-10-22 NOTE — Telephone Encounter (Signed)
From pt

## 2018-10-25 LAB — BASIC METABOLIC PANEL
BUN/Creatinine Ratio: 17 (ref 9–23)
BUN: 11 mg/dL (ref 6–24)
CO2: 26 mmol/L (ref 20–29)
Calcium: 9.6 mg/dL (ref 8.7–10.2)
Chloride: 98 mmol/L (ref 96–106)
Creatinine, Ser: 0.66 mg/dL (ref 0.57–1.00)
GFR calc Af Amer: 114 mL/min/{1.73_m2} (ref >59)
GFR calc non Af Amer: 99 mL/min/{1.73_m2} (ref >59)
Glucose: 89 mg/dL (ref 65–99)
Potassium: 5.6 mmol/L — ABNORMAL HIGH (ref 3.5–5.2)
Sodium: 138 mmol/L (ref 134–144)

## 2018-10-28 NOTE — Telephone Encounter (Signed)
From pt

## 2018-10-30 ENCOUNTER — Ambulatory Visit (INDEPENDENT_AMBULATORY_CARE_PROVIDER_SITE_OTHER): Payer: BC Managed Care – PPO | Admitting: Cardiology

## 2018-10-30 ENCOUNTER — Other Ambulatory Visit: Payer: Self-pay

## 2018-10-30 ENCOUNTER — Encounter: Payer: Self-pay | Admitting: Cardiology

## 2018-10-30 VITALS — BP 139/76 | HR 65 | Temp 98.6°F | Ht 62.0 in | Wt 140.0 lb

## 2018-10-30 DIAGNOSIS — E7800 Pure hypercholesterolemia, unspecified: Secondary | ICD-10-CM | POA: Diagnosis not present

## 2018-10-30 DIAGNOSIS — I491 Atrial premature depolarization: Secondary | ICD-10-CM

## 2018-10-30 DIAGNOSIS — I10 Essential (primary) hypertension: Secondary | ICD-10-CM | POA: Diagnosis not present

## 2018-10-30 DIAGNOSIS — I480 Paroxysmal atrial fibrillation: Secondary | ICD-10-CM | POA: Diagnosis not present

## 2018-10-30 MED ORDER — METOPROLOL SUCCINATE ER 50 MG PO TB24: 50.0000 mg | ORAL_TABLET | Freq: Every day | ORAL | 2 refills | Status: DC

## 2018-10-30 NOTE — Progress Notes (Signed)
Primary Physician:  Larene Beach, MD   Patient ID: Gwendolyn Thompson, female    DOB: [birth year], 58 y.o.   MRN: [id number]  Subjective:    Chief Complaint  Patient presents with   Hypertension   Atrial Fibrillation    HPI: [name]  is a 58 y.o. female  with hypertension, hyperlipidemia, history of basal cell carcinoma, with brief episodes of A fib and atrial tachycardia on 4 day monitor performed by PCP on 07/31/2018.  Patient had 4 brief episodes of A fib with longest episode lasting 2 mins and 31 episodes of atrial tachycardia that were also brief. Symptoms correlated with arrhythmias. She has history of difficult to control hypertension. At her last office visit, [name] was increased to 360 mg daily. Chlorthalidone was discontinued and she was started on Lisinopril HCT. She is on Xarelto and tolerating this well without any bleeding. She underwent lexiscan nuclear stress test on 09/11/18 that was considered low risk study. Echo 09/05/18 showed moderate LVH, normal LVEF. Moderate MR. She had been doing well with increased dose of diltiazem.  At her last office visit, due to continued palpitations and variations in her heart rate, metoprolol was started. Heart rate has improved since being on the Metoprolol and much more stable. She is overall feeling much better. Feels less anxious. She did recently have hyperkalemia on recent BMP. Lipids were to be performed, but were missed.   No history of diabetes or thyroid disorders. She is on lipitor for hyperlipidemia that she states is well controlled.   She is a former smoker that quit over 30 years ago. Occasional alcohol use. No illicit drug use.   Past Medical History:  Diagnosis Date   Anxious depression    Atrial fibrillation ([place]) 09/17/2018   B12 deficiency-borderline 09/17/2013   Level was 210, July 2015   Cancer [facility])    basal cell removed from nose   Cervical dysplasia    Depression    Esophageal reflux     HTN (hypertension)    Hyperlipidemia    Hyperplastic colon polyp    Palpitations     Past Surgical History:  Procedure Laterality Date   COLONOSCOPY W/ BIOPSIES  08-26-2012   Dr. Glennon Hamilton    COLPOSCOPY     GYNECOLOGIC CRYOSURGERY  1987   MOUTH SURGERY      Social History   Socioeconomic History   Marital status: Married    Spouse name: Not on file   Number of children: 0   Years of education: Not on file   Highest education level: Not on file  Occupational History   Occupation: tax Optometrist  Social Needs   Financial resource strain: Not on file   Food insecurity    Worry: Not on file    Inability: Not on file   Transportation needs    Medical: Not on file    Non-medical: Not on file  Tobacco Use   Smoking status: Former Smoker    Packs/day: 0.25    Years: 3.00    Pack years: 0.75    Types: Cigarettes    Quit date: 08/20/1988    Years since quitting: 30.2   Smokeless tobacco: Never Used  Substance and Sexual Activity   Alcohol use: Yes    Alcohol/week: 2.0 standard drinks    Types: 2 Standard drinks or equivalent per week    Comment: 4-6 DRINKS A WEEK- WINE   Drug use: No   Sexual activity: Not Currently  Birth control/protection: Post-menopausal    Comment: 1ST intercourse- 19, partners- 72, married- 23 yrs   Lifestyle   Physical activity    Days per week: Not on file    Minutes per session: Not on file   Stress: Not on file  Relationships   Social connections    Talks on phone: Not on file    Gets together: Not on file    Attends religious service: Not on file    Active member of club or organization: Not on file    Attends meetings of clubs or organizations: Not on file    Relationship status: Not on file   Intimate partner violence    Fear of current or ex partner: Not on file    Emotionally abused: Not on file    Physically abused: Not on file    Forced sexual activity: Not on file  Other Topics Concern   Not on file    Social History Narrative   Lives w/ female partner, no children   Tax Optometrist   2 caffeine beverages daily    Review of Systems  Constitution: Negative for decreased appetite, malaise/fatigue, weight gain and weight loss.  Eyes: Negative for visual disturbance.  Cardiovascular: Positive for palpitations. Negative for chest pain, claudication, dyspnea on exertion, leg swelling, orthopnea and syncope.  Respiratory: Negative for hemoptysis and wheezing.   Endocrine: Negative for cold intolerance and heat intolerance.  Hematologic/Lymphatic: Negative for bleeding problem. Does not bruise/bleed easily.  Skin: Negative for nail changes.  Musculoskeletal: Negative for muscle weakness and myalgias.  Gastrointestinal: Negative for abdominal pain, change in bowel habit, nausea and vomiting.  Neurological: Negative for difficulty with concentration, dizziness, focal weakness and headaches.  Psychiatric/Behavioral: Negative for altered mental status and suicidal ideas. The patient is not nervous/anxious.   All other systems reviewed and are negative.     Objective:  Blood pressure 139/76, pulse 65, temperature 98.6 F (37 C), height 5' 2" (1.575 m), weight 140 lb (63.5 kg), SpO2 99 %. Body mass index is 25.61 kg/m.    Physical Exam  Constitutional: She is oriented to person, place, and time. Vital signs are normal. She appears well-developed and well-nourished.  HENT:  Head: Normocephalic and atraumatic.  Neck: Normal range of motion.  Cardiovascular: Normal rate, regular rhythm and intact distal pulses. Frequent extrasystoles are present.  Murmur heard.  Early systolic murmur is present at the upper right sternal border and apex. Pulmonary/Chest: Effort normal and breath sounds normal. No accessory muscle usage. No respiratory distress.  Abdominal: Soft. Bowel sounds are normal.  Musculoskeletal: Normal range of motion.  Neurological: She is alert and oriented to person, place, and  time.  Skin: Skin is warm and dry.  Vitals reviewed.  Radiology: No results found.  Laboratory examination:   07/24/2018: Potassium 3.2, creatinine 0.6, EGFR greater than 90, AST 51, ALT normal at 45 (previously AST 74 and ALT 66 in January 2020).  TSH 1.2.  Normal H&H, MCH 33.1, CBC otherwise normal.  02/21/2018: Cholesterol 287, triglycerides 239, HDL 79, LDL 183.  CMP Latest Ref Rng & Units 10/24/2018 04/14/2010  Glucose 65 - 99 mg/dL 89 96  BUN 6 - 24 mg/dL 11 12  Creatinine 0.57 - 1.00 mg/dL 0.66 0.73  Sodium 134 - 144 mmol/L 138 134(L)  Potassium 3.5 - 5.2 mmol/L 5.6(H) 3.6  Chloride 96 - 106 mmol/L 98 94(L)  CO2 20 - 29 mmol/L 26 27  Calcium 8.7 - 10.2 mg/dL 9.6 9.8  Total  Protein 6.0 - 8.3 g/dL - 7.6  Total Bilirubin 0.3 - 1.2 mg/dL - 0.4  Alkaline Phos 39 - 117 U/L - 57  AST 0 - 37 U/L - 28  ALT 0 - 35 U/L - 19   CBC Latest Ref Rng & Units 04/14/2010  WBC 4.0 - 10.5 K/uL 6.6  Hemoglobin 12.0 - 15.0 g/dL 13.2  Hematocrit 36.0 - 46.0 % 37.1  Platelets 150 - 400 K/uL 268   Lipid Panel  No results found for: CHOL, TRIG, HDL, CHOLHDL, VLDL, LDLCALC, LDLDIRECT HEMOGLOBIN A1C No results found for: HGBA1C, MPG TSH No results for input(s): TSH in the last 8760 hours.  PRN Meds:. Medications Discontinued During This Encounter  Medication Reason   metoprolol tartrate (LOPRESSOR) 25 MG tablet Dose change   Current Meds  Medication Sig   ALPRAZolam (XANAX) 0.5 MG tablet Take by mouth as needed.   AMBULATORY NON FORMULARY MEDICATION 500 mg. AHCC Immune Booster  2 tablets at breakfast    AMBULATORY NON FORMULARY MEDICATION ALJ Herbal Respiratory Booster 4 tablets at breakfast   atorvastatin (LIPITOR) 80 MG tablet Take 1 tablet (80 mg total) by mouth daily.   Calcium Carbonate Antacid 600 MG chewable tablet Chew 1 tablet by mouth as needed for indigestion or heartburn.    Cetirizine HCl (ZYRTEC ALLERGY PO) Take by mouth.   Cholecalciferol (VITAMIN D-3 PO) Take by  mouth daily.   Coenzyme Q10 (CO Q 10 PO) Take by mouth.   diltiazem (CARDIZEM CD) 360 MG 24 hr capsule Take 1 capsule (360 mg total) by mouth daily.   fluticasone (FLONASE) 50 MCG/ACT nasal spray INHALE 1 SPRAY BY NASAL ROUTE DAILY.   lisinopril-hydrochlorothiazide (ZESTORETIC) 20-25 MG tablet TAKE 1 TABLET BY MOUTH EVERY DAY (Patient taking differently: Take 2 tablets by mouth daily. )   rivaroxaban (XARELTO) 20 MG TABS tablet Take by mouth daily.   Turmeric Curcumin 500 MG CAPS Take 2 capsules by mouth daily.   Venlafaxine HCl 75 MG TB24 Take 1 capsule by mouth every evening.     Cardiac Studies:   Echocardiogram 09/05/2018: Moderate concentric hypertrophy of the left ventricle. Left ventricle cavity is normal in size. Normal LV systolic function with EF 58%. Normal global wall motion. Normal diastolic filling pattern. Calculated EF 58%. Moderate (Grade II) mitral regurgitation. Inadequate TR jet to estimate pulmonary artery systolic pressure. Normal right atrial pressure.   Lexiscan Sestamibi Stress Test 09/11/2018: Resting EKG normal sinus rhythm, LVH, occasional PVCs.  Stress EKG nondiagnostic due to pharmacologic stress testing.  Hypertensive throughout the study with resting blood pressure 160/110 mmHg. Myocardial perfusion imaging is normal. Left ventricular ejection fraction is  74% with normal wall motion. Low risk study.  4 day Ziopatch 07/31/2018: Dominant rhythm, Normal sinus rhythm. Episodes of brief atrial fibrillation with longest episode being on 06/17 at 1255 for 2 m 56s 112-182 bpm. Some episodes reported as A fib are questionable for Atrial tachycardia (06/19 and 06/18). A fib burden <1%. Patient triggered events for fluttering/racing correlated with A fib. 31 reported episodes of atrial tachycardia with fastest HR 204 bpm on 06/17 at 1437. Avg HR 147 bpm. Longest episode for 8 m on 06/16 at 1505. Rare PAC and PVC with 1 episode ventricular bigeminy for 3.4 s on 06/19.    Renal duplex 08/20/11: No renal artery stenosis. Normal kidney size bilateral.  Assessment:     ICD-10-CM   1. Paroxysmal atrial fibrillation (HCC)  I48.0 EKG 12-Lead  2. PAC (premature atrial contraction)  I49.1   3. Pure hypercholesterolemia  E78.00 Lipid Profile  4. Essential hypertension  I10   5. Hypercalcemia  V01.31 Basic metabolic panel   [id number]: Risk Score  2,  Yearly risk of stroke  2.2%. Recommendation: Anticoagulation    EKG 10/30/2018: Sinus tachycardia at 116 bpm, frequent PAC's, normal axis, LVH, no evidence of ischemia.   Recommendations:   Ms. Guiselle Mian is a pleasant 58 year old female last seen by [place] in 2013 for uncontrolled hypertension.  She has past medical history of difficult to control hypertension, hyperlipidemia, recently developed symptoms of palpitations and was found to have episodes of atrial fibrillation on event monitor..   Since being on Metoprolol, her symptoms have significantly improved. She is overall feeling much better. Blood pressure has also improved. She is walking regularly without difficulty. She does continue to have frequent PAC's on EKG. Will further increase her Metoprolol to 50 mg daily.   She was recently found to have hyperkalemia on recent BMP, suspect related to diet. Advised her to cut back on foods high in potassium, will recheck in a few weeks along with her lipids to follow up on hyperlipidemia. I will see her back in 8 weeks for follow up.   Miquel Dunn, MSN, APRN, FNP-C [facility]. [place] Office: [phone] Fax: [phone]

## 2018-10-31 NOTE — Telephone Encounter (Signed)
Please read

## 2018-11-01 ENCOUNTER — Encounter: Payer: Self-pay | Admitting: Cardiology

## 2018-11-03 ENCOUNTER — Other Ambulatory Visit: Payer: Self-pay | Admitting: Cardiology

## 2018-11-12 ENCOUNTER — Encounter: Payer: Self-pay | Admitting: Gynecology

## 2018-11-17 ENCOUNTER — Other Ambulatory Visit: Payer: Self-pay | Admitting: Cardiology

## 2018-11-18 ENCOUNTER — Other Ambulatory Visit: Payer: Self-pay | Admitting: Cardiology

## 2018-12-03 NOTE — Telephone Encounter (Signed)
Please read;  Im working on the [employer] part

## 2018-12-09 DIAGNOSIS — E7800 Pure hypercholesterolemia, unspecified: Secondary | ICD-10-CM

## 2018-12-09 NOTE — Telephone Encounter (Signed)
Please read

## 2018-12-10 NOTE — Telephone Encounter (Signed)
Please read

## 2018-12-19 NOTE — Telephone Encounter (Signed)
Please read

## 2018-12-21 LAB — COMPREHENSIVE METABOLIC PANEL
ALT: 35 IU/L — ABNORMAL HIGH (ref 0–32)
AST: 48 IU/L — ABNORMAL HIGH (ref 0–40)
Albumin/Globulin Ratio: 1.4 (ref 1.2–2.2)
Albumin: 4.7 g/dL (ref 3.8–4.9)
Alkaline Phosphatase: 79 IU/L (ref 39–117)
BUN/Creatinine Ratio: 16 (ref 9–23)
BUN: 12 mg/dL (ref 6–24)
Bilirubin Total: 0.5 mg/dL (ref 0.0–1.2)
CO2: 22 mmol/L (ref 20–29)
Calcium: 10 mg/dL (ref 8.7–10.2)
Chloride: 95 mmol/L — ABNORMAL LOW (ref 96–106)
Creatinine, Ser: 0.75 mg/dL (ref 0.57–1.00)
GFR calc Af Amer: 102 mL/min/{1.73_m2} (ref >59)
GFR calc non Af Amer: 89 mL/min/{1.73_m2} (ref >59)
Globulin, Total: 3.4 g/dL (ref 1.5–4.5)
Glucose: 87 mg/dL (ref 65–99)
Potassium: 4.7 mmol/L (ref 3.5–5.2)
Sodium: 136 mmol/L (ref 134–144)
Total Protein: 8.1 g/dL (ref 6.0–8.5)

## 2018-12-21 LAB — LIPID PANEL
Chol/HDL Ratio: 3.1 ratio (ref 0.0–4.4)
Cholesterol, Total: 250 mg/dL — ABNORMAL HIGH (ref 100–199)
HDL: 80 mg/dL (ref >39)
LDL Chol Calc (NIH): 127 mg/dL — ABNORMAL HIGH (ref 0–99)
Triglycerides: 251 mg/dL — ABNORMAL HIGH (ref 0–149)
VLDL Cholesterol Cal: 43 mg/dL — ABNORMAL HIGH (ref 5–40)

## 2018-12-23 ENCOUNTER — Telehealth: Payer: Self-pay

## 2018-12-23 NOTE — Telephone Encounter (Signed)
Called pt to inform her that it was ok for her to take the norco

## 2018-12-23 NOTE — Telephone Encounter (Signed)
Yes that is fine

## 2018-12-23 NOTE — Telephone Encounter (Signed)
Pt called to inform us that she went to the dentist this morning and was done more then expected. Pt was given Norco and wants to know if it is ok to take it. Lease advise thank you

## 2018-12-25 ENCOUNTER — Other Ambulatory Visit: Payer: Self-pay

## 2018-12-25 ENCOUNTER — Encounter: Payer: Self-pay | Admitting: Cardiology

## 2018-12-25 ENCOUNTER — Ambulatory Visit (INDEPENDENT_AMBULATORY_CARE_PROVIDER_SITE_OTHER): Payer: BC Managed Care – PPO | Admitting: Cardiology

## 2018-12-25 VITALS — BP 130/75 | HR 63 | Temp 99.1°F | Ht 64.0 in | Wt 140.0 lb

## 2018-12-25 DIAGNOSIS — I10 Essential (primary) hypertension: Secondary | ICD-10-CM

## 2018-12-25 DIAGNOSIS — I491 Atrial premature depolarization: Secondary | ICD-10-CM | POA: Diagnosis not present

## 2018-12-25 DIAGNOSIS — I480 Paroxysmal atrial fibrillation: Secondary | ICD-10-CM

## 2018-12-25 DIAGNOSIS — E7800 Pure hypercholesterolemia, unspecified: Secondary | ICD-10-CM | POA: Diagnosis not present

## 2018-12-25 MED ORDER — EZETIMIBE 10 MG PO TABS: 10.0000 mg | ORAL_TABLET | Freq: Every day | ORAL | 1 refills | Status: DC

## 2018-12-25 NOTE — Progress Notes (Signed)
Primary Physician:  Larene Beach, MD   Patient ID: Gwendolyn Thompson, female    DOB: November 03, 1960, 58 y.o.   MRN: [id number]  Subjective:    Chief Complaint  Patient presents with  . Hypertension  . PAF  . Follow-up    Pac's and 6 week  . Results    lab    HPI: Gwendolyn Thompson  is a 58 y.o. female  with hypertension, hyperlipidemia, history of basal cell carcinoma, with brief episodes of A fib and atrial tachycardia on 4 day monitor performed by PCP on 07/31/2018.  Patient had 4 brief episodes of A fib with longest episode lasting 2 mins and 31 episodes of atrial tachycardia that were also brief. Symptoms correlated with arrhythmias. She has history of difficult to control hypertension. At her last office visit, Gwendolyn Thompson was increased to 360 mg daily. Chlorthalidone was discontinued and she was started on Lisinopril HCT. She is on Xarelto and tolerating this well without any bleeding. She underwent lexiscan nuclear stress test on 09/11/18 that was considered low risk study. Echo 09/05/18 showed moderate LVH, normal LVEF. Moderate MR. She had been doing well with increased dose of diltiazem.  At her last visit, metoprolol was increased due to frequent PVC's. She has had recent labs for follow up on hyperlipidemia. She reports that she is feeling well. She does admit that her diet could be better since the pandemic. She has stopped taking her Effexor, and has noticed some increased tearfulness. She is considering restarting this.   No history of diabetes or thyroid disorders. She is on lipitor for hyperlipidemia that she states is well controlled.   She is a former smoker that quit over 30 years ago. Occasional alcohol use. No illicit drug use.   Past Medical History:  Diagnosis Date  . Anxious depression   . Atrial fibrillation ([place]) 09/17/2018  . B12 deficiency-borderline 09/17/2013   Level was 210, July 2015  . Cancer ([place])    basal cell removed from nose  . Cervical  dysplasia   . Depression   . Esophageal reflux   . HTN (hypertension)   . Hyperlipidemia   . Hyperplastic colon polyp   . Palpitations     Past Surgical History:  Procedure Laterality Date  . COLONOSCOPY W/ BIOPSIES  08-26-2012   Dr. Glennon Hamilton   . COLPOSCOPY    . GYNECOLOGIC CRYOSURGERY  1987  . MOUTH SURGERY      Social History   Socioeconomic History  . Marital status: Married    Spouse name: Not on file  . Number of children: 0  . Years of education: Not on file  . Highest education level: Not on file  Occupational History  . Occupation: tax Optometrist  Social Needs  . Financial resource strain: Not on file  . Food insecurity    Worry: Not on file    Inability: Not on file  . Transportation needs    Medical: Not on file    Non-medical: Not on file  Tobacco Use  . Smoking status: Former Smoker    Packs/day: 0.25    Years: 3.00    Pack years: 0.75    Types: Cigarettes    Quit date: 08/20/1988    Years since quitting: 30.3  . Smokeless tobacco: Never Used  Substance and Sexual Activity  . Alcohol use: Yes    Alcohol/week: 2.0 standard drinks    Types: 2 Standard drinks or equivalent per week    Comment: 4-6 DRINKS  A WEEK- WINE  . Drug use: No  . Sexual activity: Not Currently    Birth control/protection: Post-menopausal    Comment: 1ST intercourse- 19, partners- 77, married- 23 yrs   Lifestyle  . Physical activity    Days per week: Not on file    Minutes per session: Not on file  . Stress: Not on file  Relationships  . Social Herbalist on phone: Not on file    Gets together: Not on file    Attends religious service: Not on file    Active member of club or organization: Not on file    Attends meetings of clubs or organizations: Not on file    Relationship status: Not on file  . Intimate partner violence    Fear of current or ex partner: Not on file    Emotionally abused: Not on file    Physically abused: Not on file    Forced sexual activity:  Not on file  Other Topics Concern  . Not on file  Social History Narrative   Lives w/ female partner, no children   Tax Optometrist   2 caffeine beverages daily    Review of Systems  Constitution: Negative for decreased appetite, malaise/fatigue, weight gain and weight loss.  Eyes: Negative for visual disturbance.  Cardiovascular: Negative for chest pain, claudication, dyspnea on exertion, leg swelling, orthopnea, palpitations and syncope.  Respiratory: Negative for hemoptysis and wheezing.   Endocrine: Negative for cold intolerance and heat intolerance.  Hematologic/Lymphatic: Negative for bleeding problem. Does not bruise/bleed easily.  Skin: Negative for nail changes.  Musculoskeletal: Negative for muscle weakness and myalgias.  Gastrointestinal: Negative for abdominal pain, change in bowel habit, nausea and vomiting.  Neurological: Negative for difficulty with concentration, dizziness, focal weakness and headaches.  Psychiatric/Behavioral: Negative for altered mental status and suicidal ideas. The patient is not nervous/anxious.   All other systems reviewed and are negative.     Objective:  Blood pressure 130/75, pulse 63, temperature 99.1 F (37.3 C), height _0  (1.626 m), weight 140 lb (63.5 kg), SpO2 98 %. Body mass index is 24.03 kg/m.    Physical Exam  Constitutional: She is oriented to person, place, and time. Vital signs are normal. She appears well-developed and well-nourished.  HENT:  Head: Normocephalic and atraumatic.  Neck: Normal range of motion.  Cardiovascular: Normal rate, regular rhythm and intact distal pulses. Frequent extrasystoles are present.  Murmur heard.  Early systolic murmur is present at the upper right sternal border and apex. Pulmonary/Chest: Effort normal and breath sounds normal. No accessory muscle usage. No respiratory distress.  Abdominal: Soft. Bowel sounds are normal.  Musculoskeletal: Normal range of motion.  Neurological: She is  alert and oriented to person, place, and time.  Skin: Skin is warm and dry.  Vitals reviewed.  Radiology: No results found.  Laboratory examination:   07/24/2018: Potassium 3.2, creatinine 0.6, EGFR greater than 90, AST 51, ALT normal at 45 (previously AST 74 and ALT 66 in January 2020).  TSH 1.2.  Normal H&H, MCH 33.1, CBC otherwise normal.  02/21/2018: Cholesterol 287, triglycerides 239, HDL 79, LDL 183.  CMP Latest Ref Rng & Units 12/20/2018 10/24/2018 04/14/2010  Glucose 65 - 99 mg/dL 87 89 96  BUN 6 - 24 mg/dL _1 Creatinine 0.57 - 1.00 mg/dL 0.75 0.66 0.73  Sodium 134 - 144 mmol/L 136 138 134(L)  Potassium 3.5 - 5.2 mmol/L 4.7 5.6(H) 3.6  Chloride 96 - 106 mmol/L  95(L) 98 94(L)  CO2 20 - 29 mmol/L _0 Calcium 8.7 - 10.2 mg/dL 10.0 9.6 9.8  Total Protein 6.0 - 8.5 g/dL 8.1 - 7.6  Total Bilirubin 0.0 - 1.2 mg/dL 0.5 - 0.4  Alkaline Phos 39 - 117 IU/L 79 - 57  AST 0 - 40 IU/L 48(H) - 28  ALT 0 - 32 IU/L 35(H) - 19   CBC Latest Ref Rng & Units 04/14/2010  WBC 4.0 - 10.5 K/uL 6.6  Hemoglobin 12.0 - 15.0 g/dL 13.2  Hematocrit 36.0 - 46.0 % 37.1  Platelets 150 - 400 K/uL 268   Lipid Panel     Component Value Date/Time   CHOL 250 (H) 12/20/2018 0857   TRIG 251 (H) 12/20/2018 0857   HDL 80 12/20/2018 0857   CHOLHDL 3.1 12/20/2018 0857   LDLCALC 127 (H) 12/20/2018 0857   HEMOGLOBIN A1C No results found for: HGBA1C, MPG TSH No results for input(s): TSH in the last 8760 hours.  PRN Meds:. Medications Discontinued During This Encounter  Medication Reason  . ALPRAZolam (XANAX) 0.5 MG tablet Error  . Venlafaxine HCl 75 MG TB24 Error  . Turmeric Curcumin 500 MG CAPS Error  . metoprolol tartrate (LOPRESSOR) 25 MG tablet Error   Current Meds  Medication Sig  . AMBULATORY NON FORMULARY MEDICATION 500 mg. AHCC Immune Booster  2 tablets at breakfast   . AMBULATORY NON FORMULARY MEDICATION ALJ Herbal Respiratory Booster 4 tablets at breakfast  . atorvastatin (LIPITOR)  80 MG tablet Take 1 tablet (80 mg total) by mouth daily.  . Calcium Carbonate Antacid 600 MG chewable tablet Chew 1 tablet by mouth as needed for indigestion or heartburn.   . Cetirizine HCl (ZYRTEC ALLERGY PO) Take by mouth.  . Cholecalciferol (VITAMIN D-3 PO) Take by mouth daily.  . clindamycin (CLEOCIN) 150 MG capsule Take 150 mg by mouth 4 (four) times daily.  . Coenzyme Q10 (CO Q 10 PO) Take by mouth.  . diltiazem (CARDIZEM CD) 360 MG 24 hr capsule TAKE 1 CAPSULE BY MOUTH EVERY DAY  . fluticasone (FLONASE) 50 MCG/ACT nasal spray INHALE 1 SPRAY BY NASAL ROUTE DAILY.  Marland Kitchen lisinopril-hydrochlorothiazide (ZESTORETIC) 20-25 MG tablet TAKE 1 TABLET BY MOUTH EVERY DAY  . metoprolol succinate (TOPROL-XL) 50 MG 24 hr tablet Take 1 tablet (50 mg total) by mouth daily. Take with or immediately following a meal.  . rivaroxaban (XARELTO) 20 MG TABS tablet Take by mouth daily.  . TURMERIC CURCUMIN PO Take 500 mg by mouth 2 (two) times a day.  . [DISCONTINUED] metoprolol tartrate (LOPRESSOR) 25 MG tablet TAKE 1 TABLET BY MOUTH TWICE A DAY    Cardiac Studies:   Echocardiogram 09/05/2018: Moderate concentric hypertrophy of the left ventricle. Left ventricle cavity is normal in size. Normal LV systolic function with EF 58%. Normal global wall motion. Normal diastolic filling pattern. Calculated EF 58%. Moderate (Grade II) mitral regurgitation. Inadequate TR jet to estimate pulmonary artery systolic pressure. Normal right atrial pressure.   Lexiscan Sestamibi Stress Test 09/11/2018: Resting EKG normal sinus rhythm, LVH, occasional PVCs.  Stress EKG nondiagnostic due to pharmacologic stress testing.  Hypertensive throughout the study with resting blood pressure 160/110 mmHg. Myocardial perfusion imaging is normal. Left ventricular ejection fraction is  74% with normal wall motion. Low risk study.  4 day Ziopatch 07/31/2018: Dominant rhythm, Normal sinus rhythm. Episodes of brief atrial fibrillation with  longest episode being on 06/17 at 1255 for 2 m 56s 112-182 bpm. Some episodes reported  as A fib are questionable for Atrial tachycardia (06/19 and 06/18). A fib burden <1%. Patient triggered events for fluttering/racing correlated with A fib. 31 reported episodes of atrial tachycardia with fastest HR 204 bpm on 06/17 at 1437. Avg HR 147 bpm. Longest episode for 8 m on 06/16 at 1505. Rare PAC and PVC with 1 episode ventricular bigeminy for 3.4 s on 06/19.   Renal duplex 08/20/11: No renal artery stenosis. Normal kidney size bilateral.  Assessment:     ICD-10-CM   1. PAC (premature atrial contraction)  I49.1   2. Paroxysmal atrial fibrillation (HCC)  I48.0   3. Pure hypercholesterolemia  E78.00 Lipid Profile  4. Essential hypertension  I10    CHA2DS2-VASCScore: Risk Score  2,  Yearly risk of stroke  2.2%. Recommendation: Anticoagulation    EKG 10/30/2018: Sinus tachycardia at 116 bpm, frequent PAC's, normal axis, LVH, no evidence of ischemia.   Recommendations:   I discussed recently obtained lab results with the patient, LDL has improved with high dose of Lipitor. Her liver enzymes are slightly elevated, but have been stable. Feel that we should try to obtain LDL of at least less than 100. I will start her on Zetia 10 mg daily. She will need repeat lipids in approximately 3 months for follow-up. Her triglycerides are also elevated that is likely purely related to her diet. She was counseled on diet modifications to help with this.  Her palpitations have significantly improved since being on diltiazem and metoprolol. Blood pressure and heart rate are stable. We will continue the same. She does continue to be on anticoagulation that she is tolerating well. Her CHA2DS2-VASc is 2, we have had lengthy discussion regarding benefits and risk of anticoagulation. I do feel that it is reasonable to continue with anticoagulation given her significant risk factors for cardioembolic event and especially since  she is tolerating this well. She is aware of bleeding risk with anticoagulation and also is agreeable to continuing with this. We'll plan to see her back in 3 months after her labs for follow-up. Encouraged her to contact me sooner if needed.   Miquel Dunn, MSN, APRN, FNP-C [facility]. [employer] Office: [phone] Fax: [phone]

## 2018-12-26 NOTE — Telephone Encounter (Signed)
Please read

## 2019-01-02 ENCOUNTER — Other Ambulatory Visit: Payer: Self-pay | Admitting: Obstetrics & Gynecology

## 2019-01-02 DIAGNOSIS — Z1231 Encounter for screening mammogram for malignant neoplasm of breast: Secondary | ICD-10-CM

## 2019-01-24 ENCOUNTER — Other Ambulatory Visit: Payer: Self-pay

## 2019-01-24 MED ORDER — DILTIAZEM HCL ER COATED BEADS 360 MG PO CP24: 360.0000 mg | ORAL_CAPSULE | Freq: Every day | ORAL | 1 refills | Status: DC

## 2019-01-30 ENCOUNTER — Other Ambulatory Visit: Payer: Self-pay

## 2019-01-30 DIAGNOSIS — I10 Essential (primary) hypertension: Secondary | ICD-10-CM

## 2019-01-30 MED ORDER — METOPROLOL SUCCINATE ER 50 MG PO TB24: 50.0000 mg | ORAL_TABLET | Freq: Every day | ORAL | 2 refills | Status: DC

## 2019-02-18 ENCOUNTER — Other Ambulatory Visit: Payer: Self-pay

## 2019-02-20 ENCOUNTER — Ambulatory Visit (INDEPENDENT_AMBULATORY_CARE_PROVIDER_SITE_OTHER): Payer: BC Managed Care – PPO | Admitting: Obstetrics & Gynecology

## 2019-02-20 ENCOUNTER — Encounter: Payer: BLUE CROSS/BLUE SHIELD | Admitting: Obstetrics & Gynecology

## 2019-02-20 ENCOUNTER — Encounter: Payer: Self-pay | Admitting: Obstetrics & Gynecology

## 2019-02-20 ENCOUNTER — Other Ambulatory Visit: Payer: Self-pay

## 2019-02-20 VITALS — BP 140/90 | Ht 61.0 in | Wt 136.0 lb

## 2019-02-20 DIAGNOSIS — Z01419 Encounter for gynecological examination (general) (routine) without abnormal findings: Secondary | ICD-10-CM

## 2019-02-20 DIAGNOSIS — Z780 Asymptomatic menopausal state: Secondary | ICD-10-CM

## 2019-02-20 NOTE — Progress Notes (Signed)
Gwendolyn Thompson 09-24-1960 [id number]   History:    59 y.o. G0 Same sex wife  RP:  Established patient presenting for annual gyn exam   HPI: Menopause, well on no hormone replacement therapy.  No postmenopausal bleeding.  No pelvic pain.  Pap negative 2018.  Urine and bowel movements normal.  Breasts normal.  Screening Mammo scheduled. Body mass index 25.7.  Walking daily.  Health labs with family physician.  Diagnosed with A. fib in 2020, followed by cardio.  Colonoscopy July 2014, polyp removed.  BD normal 04/2017.  Past medical history,surgical history, family history and social history were all reviewed and documented in the EPIC chart.  Gynecologic History No LMP recorded. Patient is postmenopausal.  Obstetric History OB History  Gravida Para Term Preterm AB Living  0            SAB TAB Ectopic Multiple Live Births                ROS: A ROS was performed and pertinent positives and negatives are included in the history.  GENERAL: No fevers or chills. HEENT: No change in vision, no earache, sore throat or sinus congestion. NECK: No pain or stiffness. CARDIOVASCULAR: No chest pain or pressure. No palpitations. PULMONARY: No shortness of breath, cough or wheeze. GASTROINTESTINAL: No abdominal pain, nausea, vomiting or diarrhea, melena or bright red blood per rectum. GENITOURINARY: No urinary frequency, urgency, hesitancy or dysuria. MUSCULOSKELETAL: No joint or muscle pain, no back pain, no recent trauma. DERMATOLOGIC: No rash, no itching, no lesions. ENDOCRINE: No polyuria, polydipsia, no heat or cold intolerance. No recent change in weight. HEMATOLOGICAL: No anemia or easy bruising or bleeding. NEUROLOGIC: No headache, seizures, numbness, tingling or weakness. PSYCHIATRIC: No depression, no loss of interest in normal activity or change in sleep pattern.     Exam:   BP 140/90   Ht 5\' 1"  (1.549 m)   Wt 136 lb (61.7 kg)   BMI 25.70 kg/m   Body mass index is 25.7  kg/m.  General appearance : Well developed well nourished female. No acute distress HEENT: Eyes: no retinal hemorrhage or exudates,  Neck supple, trachea midline, no carotid bruits, no thyroidmegaly Lungs: Clear to auscultation, no rhonchi or wheezes, or rib retractions  Heart: Regular rate and rhythm, no murmurs or gallops Breast:Examined in sitting and supine position were symmetrical in appearance, no palpable masses or tenderness,  no skin retraction, no nipple inversion, no nipple discharge, no skin discoloration, no axillary or supraclavicular lymphadenopathy Abdomen: no palpable masses or tenderness, no rebound or guarding Extremities: no edema or skin discoloration or tenderness  Pelvic: Vulva: Normal             Vagina: No gross lesions or discharge  Cervix: No gross lesions or discharge  Uterus  AV, normal size, shape and consistency, non-tender and mobile  Adnexa  Without masses or tenderness  Anus: Normal.  Rectal exam Normal.   Assessment/Plan:  59 y.o. female for annual exam   1. Well female exam with routine gynecological exam Normal gynecologic exam in menopause.  Pap test was negative in 2018, will repeat at 3 years.  Breast exam normal.  Scheduled for a screening mammogram soon.  Colonoscopy in 2014, will repeat at 10 years.  Health labs with family physician.  Body mass index 25.7.  Continue with fitness and healthy nutrition.  Followed by cardio for atrial fibrillation.  2. Postmenopausal Well on no hormone replacement therapy.  No postmenopausal bleeding.  Bone density normal in March 2019.  Continue with daily walking.  Calcium intake of 1200 mg daily and vitamin D supplements.  Other orders - venlafaxine (EFFEXOR) 75 MG tablet; Take 75 mg by mouth 2 (two) times daily.  Princess Bruins MD, 9:02 AM 02/20/2019

## 2019-02-20 NOTE — Patient Instructions (Signed)
1. Well female exam with routine gynecological exam Normal gynecologic exam in menopause.  Pap test was negative in 2018, will repeat at 3 years.  Breast exam normal.  Scheduled for a screening mammogram soon.  Colonoscopy in 2014, will repeat at 10 years.  Health labs with family physician.  Body mass index 25.7.  Continue with fitness and healthy nutrition.  Followed by cardio for atrial fibrillation.  2. Postmenopausal Well on no hormone replacement therapy.  No postmenopausal bleeding.  Bone density normal in March 2019.  Continue with daily walking.  Calcium intake of 1200 mg daily and vitamin D supplements.  Other orders - venlafaxine (EFFEXOR) 75 MG tablet; Take 75 mg by mouth 2 (two) times daily.  Gwendolyn Thompson, it was a pleasure seeing you today!

## 2019-02-24 ENCOUNTER — Ambulatory Visit
Admission: RE | Admit: 2019-02-24 | Discharge: 2019-02-24 | Disposition: A | Discharge status: Home / Self Care | Payer: BLUE CROSS/BLUE SHIELD | Source: Ambulatory Visit | Attending: Obstetrics & Gynecology | Admitting: Obstetrics & Gynecology

## 2019-02-24 ENCOUNTER — Other Ambulatory Visit: Payer: Self-pay

## 2019-02-24 DIAGNOSIS — Z1231 Encounter for screening mammogram for malignant neoplasm of breast: Secondary | ICD-10-CM

## 2019-02-28 NOTE — Telephone Encounter (Signed)
Spoke with patient regarding her questions on COVID-19 vaccine.

## 2019-03-06 ENCOUNTER — Other Ambulatory Visit: Payer: Self-pay

## 2019-03-06 DIAGNOSIS — I491 Atrial premature depolarization: Secondary | ICD-10-CM

## 2019-03-06 MED ORDER — RIVAROXABAN 20 MG PO TABS: 20.0000 mg | ORAL_TABLET | Freq: Every day | ORAL | 3 refills | Status: DC

## 2019-04-12 LAB — COMPREHENSIVE METABOLIC PANEL
ALT: 27 IU/L (ref 0–32)
AST: 44 IU/L — ABNORMAL HIGH (ref 0–40)
Albumin/Globulin Ratio: 1.9 (ref 1.2–2.2)
Albumin: 5.1 g/dL — ABNORMAL HIGH (ref 3.8–4.9)
Alkaline Phosphatase: 67 IU/L (ref 39–117)
BUN/Creatinine Ratio: 12 (ref 9–23)
BUN: 9 mg/dL (ref 6–24)
Bilirubin Total: 0.4 mg/dL (ref 0.0–1.2)
CO2: 21 mmol/L (ref 20–29)
Calcium: 9.6 mg/dL (ref 8.7–10.2)
Chloride: 92 mmol/L — ABNORMAL LOW (ref 96–106)
Creatinine, Ser: 0.73 mg/dL (ref 0.57–1.00)
GFR calc Af Amer: 106 mL/min/{1.73_m2} (ref >59)
GFR calc non Af Amer: 92 mL/min/{1.73_m2} (ref >59)
Globulin, Total: 2.7 g/dL (ref 1.5–4.5)
Glucose: 91 mg/dL (ref 65–99)
Potassium: 4.6 mmol/L (ref 3.5–5.2)
Sodium: 132 mmol/L — ABNORMAL LOW (ref 134–144)
Total Protein: 7.8 g/dL (ref 6.0–8.5)

## 2019-04-12 LAB — LIPID PANEL
Chol/HDL Ratio: 2.6 ratio (ref 0.0–4.4)
Cholesterol, Total: 219 mg/dL — ABNORMAL HIGH (ref 100–199)
HDL: 85 mg/dL (ref >39)
LDL Chol Calc (NIH): 100 mg/dL — ABNORMAL HIGH (ref 0–99)
Triglycerides: 202 mg/dL — ABNORMAL HIGH (ref 0–149)
VLDL Cholesterol Cal: 34 mg/dL (ref 5–40)

## 2019-04-22 ENCOUNTER — Encounter: Payer: Self-pay | Admitting: Cardiology

## 2019-04-22 ENCOUNTER — Other Ambulatory Visit: Payer: Self-pay

## 2019-04-22 ENCOUNTER — Ambulatory Visit: Payer: BC Managed Care – PPO | Admitting: Cardiology

## 2019-04-22 VITALS — BP 138/67 | HR 54 | Temp 97.9°F | Resp 16 | Ht 61.0 in | Wt 140.5 lb

## 2019-04-22 DIAGNOSIS — R234 Changes in skin texture: Secondary | ICD-10-CM

## 2019-04-22 DIAGNOSIS — E7800 Pure hypercholesterolemia, unspecified: Secondary | ICD-10-CM

## 2019-04-22 DIAGNOSIS — I10 Essential (primary) hypertension: Secondary | ICD-10-CM

## 2019-04-22 DIAGNOSIS — I480 Paroxysmal atrial fibrillation: Secondary | ICD-10-CM

## 2019-04-22 DIAGNOSIS — E871 Hypo-osmolality and hyponatremia: Secondary | ICD-10-CM

## 2019-04-22 MED ORDER — LISINOPRIL 40 MG PO TABS: 40.0000 mg | ORAL_TABLET | Freq: Every day | ORAL | 2 refills | Status: DC

## 2019-04-22 NOTE — Progress Notes (Signed)
Primary Physician:  Larene Beach, MD   Patient ID: Gwendolyn Thompson, female    DOB: May 04, 1960, 59 y.o.   MRN: [id number]  Subjective:    Chief Complaint  Patient presents with  . Hyperlipidemia  . Paroxysmal Atrial Fibrilation  . Follow-up    3 Month F/U     HPI: Gwendolyn Thompson  is a 59 y.o. female  with hypertension, hyperlipidemia, history of basal cell carcinoma, with brief episodes of A fib and atrial tachycardia on 4 day monitor performed by PCP on 07/31/2018. Since being on medical therapy, she has improvement in heart racing symptoms. She now presents for 3 month follow up. Presents to discuss recent lab results.  She is doing well, heart rate and blood pressure have overall been stable. States she has only had one episode on her apple watch of elevated heart rate. She has noticed in December that her bilateral fingertips have started peeling. She denies any discoloration or numbness sensation. She attributed this to washing dishes more frequently; therefore, in January stopped washing dishes. She has been using several different OTC creams and lotions without any improvement.    No history of diabetes or thyroid disorders. She is on lipitor for hyperlipidemia.    She is a former smoker that quit over 30 years ago. Occasional alcohol use. No illicit drug use.   Past Medical History:  Diagnosis Date  . Anxious depression   . Atrial fibrillation ([place]) 09/17/2018  . B12 deficiency-borderline 09/17/2013   Level was 210, July 2015  . Cancer ([place])    basal cell removed from nose  . Cervical dysplasia   . Depression   . Esophageal reflux   . HTN (hypertension)   . Hyperlipidemia   . Hyperplastic colon polyp   . Palpitations     Past Surgical History:  Procedure Laterality Date  . COLONOSCOPY W/ BIOPSIES  08-26-2012   Dr. Glennon Hamilton   . COLPOSCOPY    . GYNECOLOGIC CRYOSURGERY  1987  . MOUTH SURGERY      Social History   Socioeconomic History  . Marital status:  Married    Spouse name: Not on file  . Number of children: 0  . Years of education: Not on file  . Highest education level: Not on file  Occupational History  . Occupation: Public house manager  Tobacco Use  . Smoking status: Former Smoker    Packs/day: 0.25    Years: 3.00    Pack years: 0.75    Types: Cigarettes    Quit date: 08/20/1988    Years since quitting: 30.6  . Smokeless tobacco: Never Used  Substance and Sexual Activity  . Alcohol use: Yes    Comment: 4-6 DRINKS A WEEK- WINE  . Drug use: No  . Sexual activity: Yes    Birth control/protection: Post-menopausal    Comment: 1ST intercourse- 19, partners- 43, married- 23 yrs   Other Topics Concern  . Not on file  Social History Narrative   Lives w/ female partner, no children   Tax Optometrist   2 caffeine beverages daily   Social Determinants of Health   Financial Resource Strain:   . Difficulty of Paying Living Expenses: Not on file  Food Insecurity:   . Worried About Charity fundraiser in the Last Year: Not on file  . Ran Out of Food in the Last Year: Not on file  Transportation Needs:   . Lack of Transportation (Medical): Not on file  . Lack of  Transportation (Non-Medical): Not on file  Physical Activity:   . Days of Exercise per Week: Not on file  . Minutes of Exercise per Session: Not on file  Stress:   . Feeling of Stress : Not on file  Social Connections:   . Frequency of Communication with Friends and Family: Not on file  . Frequency of Social Gatherings with Friends and Family: Not on file  . Attends Religious Services: Not on file  . Active Member of Clubs or Organizations: Not on file  . Attends Club or Organization Meetings: Not on file  . Marital Status: Not on file  Intimate Partner Violence:   . Fear of Current or Ex-Partner: Not on file  . Emotionally Abused: Not on file  . Physically Abused: Not on file  . Sexually Abused: Not on file    Review of Systems  Constitution: Negative for decreased  appetite, malaise/fatigue, weight gain and weight loss.  Eyes: Negative for visual disturbance.  Cardiovascular: Negative for chest pain, claudication, dyspnea on exertion, leg swelling, orthopnea, palpitations and syncope.  Respiratory: Negative for hemoptysis and wheezing.   Endocrine: Negative for cold intolerance and heat intolerance.  Hematologic/Lymphatic: Negative for bleeding problem. Does not bruise/bleed easily.  Skin: Negative for nail changes.  Musculoskeletal: Negative for muscle weakness and myalgias.  Gastrointestinal: Negative for abdominal pain, change in bowel habit, nausea and vomiting.  Neurological: Negative for difficulty with concentration, dizziness, focal weakness and headaches.  Psychiatric/Behavioral: Negative for altered mental status and suicidal ideas. The patient is not nervous/anxious.   All other systems reviewed and are negative.     Objective:  Blood pressure 138/67, pulse (!) 54, temperature 97.9 F (36.6 C), temperature source Temporal, resp. rate 16, height 5' 1" (1.549 m), weight 140 lb 8 oz (63.7 kg), SpO2 99 %. Body mass index is 26.55 kg/m.    Physical Exam  Constitutional: She is oriented to person, place, and time. Vital signs are normal. She appears well-developed and well-nourished.  HENT:  Head: Normocephalic and atraumatic.  Cardiovascular: Normal rate, regular rhythm and intact distal pulses. Frequent extrasystoles are present.  Murmur heard.  Early systolic murmur is present at the upper right sternal border and apex. Pulses:      Radial pulses are 2+ on the right side and 2+ on the left side.  Pulmonary/Chest: Effort normal and breath sounds normal. No accessory muscle usage. No respiratory distress.  Abdominal: Soft. Bowel sounds are normal.  Musculoskeletal:        General: Normal range of motion.     Cervical back: Normal range of motion.  Neurological: She is alert and oriented to person, place, and time.  Skin: Skin is warm and  dry.  Bilateral finger tip peeling  Negative Allen's test  Vitals reviewed.  Radiology: No results found.  Laboratory examination:   07/24/2018: Potassium 3.2, creatinine 0.6, EGFR greater than 90, AST 51, ALT normal at 45 (previously AST 74 and ALT 66 in January 2020).  TSH 1.2.  Normal H&H, MCH 33.1, CBC otherwise normal.  02/21/2018: Cholesterol 287, triglycerides 239, HDL 79, LDL 183.  CMP Latest Ref Rng & Units 04/11/2019 12/20/2018 10/24/2018  Glucose 65 - 99 mg/dL 91 87 89  BUN 6 - 24 mg/dL 9 12 11  Creatinine 0.57 - 1.00 mg/dL 0.73 0.75 0.66  Sodium 134 - 144 mmol/L 132(L) 136 138  Potassium 3.5 - 5.2 mmol/L 4.6 4.7 5.6(H)  Chloride 96 - 106 mmol/L 92(L) 95(L) 98  CO2 20 -   29 mmol/L 21 22 26  Calcium 8.7 - 10.2 mg/dL 9.6 10.0 9.6  Total Protein 6.0 - 8.5 g/dL 7.8 8.1 -  Total Bilirubin 0.0 - 1.2 mg/dL 0.4 0.5 -  Alkaline Phos 39 - 117 IU/L 67 79 -  AST 0 - 40 IU/L 44(H) 48(H) -  ALT 0 - 32 IU/L 27 35(H) -   CBC Latest Ref Rng & Units 04/14/2010  WBC 4.0 - 10.5 K/uL 6.6  Hemoglobin 12.0 - 15.0 g/dL 13.2  Hematocrit 36.0 - 46.0 % 37.1  Platelets 150 - 400 K/uL 268   Lipid Panel     Component Value Date/Time   CHOL 219 (H) 04/11/2019 0912   TRIG 202 (H) 04/11/2019 0912   HDL 85 04/11/2019 0912   CHOLHDL 2.6 04/11/2019 0912   LDLCALC 100 (H) 04/11/2019 0912   HEMOGLOBIN A1C No results found for: HGBA1C, MPG TSH No results for input(s): TSH in the last 8760 hours.  PRN Meds:. Medications Discontinued During This Encounter  Medication Reason  . lisinopril-hydrochlorothiazide (ZESTORETIC) 20-25 MG tablet Discontinued by provider   Current Meds  Medication Sig  . AMBULATORY NON FORMULARY MEDICATION 500 mg. AHCC Immune Booster  2 tablets at breakfast   . AMBULATORY NON FORMULARY MEDICATION ALJ Herbal Respiratory Booster 4 tablets at breakfast  . atorvastatin (LIPITOR) 80 MG tablet Take 1 tablet (80 mg total) by mouth daily.  . Calcium Carbonate Antacid 600 MG  chewable tablet Chew 1 tablet by mouth as needed for indigestion or heartburn.   . Cetirizine HCl (ZYRTEC ALLERGY PO) Take by mouth.  . Cholecalciferol (VITAMIN D-3 PO) Take by mouth daily.  . Coenzyme Q10 (CO Q 10 PO) Take by mouth.  . diltiazem (CARDIZEM CD) 360 MG 24 hr capsule Take 1 capsule (360 mg total) by mouth daily.  . ezetimibe (ZETIA) 10 MG tablet Take 1 tablet (10 mg total) by mouth daily.  . fluticasone (FLONASE) 50 MCG/ACT nasal spray INHALE 1 SPRAY BY NASAL ROUTE DAILY.  . metoprolol succinate (TOPROL-XL) 50 MG 24 hr tablet Take 1 tablet (50 mg total) by mouth daily. Take with or immediately following a meal.  . rivaroxaban (XARELTO) 20 MG TABS tablet Take 1 tablet (20 mg total) by mouth daily.  . TURMERIC CURCUMIN PO Take 500 mg by mouth 2 (two) times a day.  . venlafaxine (EFFEXOR) 75 MG tablet Take 75 mg by mouth 2 (two) times daily.  . [DISCONTINUED] lisinopril-hydrochlorothiazide (ZESTORETIC) 20-25 MG tablet TAKE 1 TABLET BY MOUTH EVERY DAY    Cardiac Studies:   Echocardiogram 09/05/2018: Moderate concentric hypertrophy of the left ventricle. Left ventricle cavity is normal in size. Normal LV systolic function with EF 58%. Normal global wall motion. Normal diastolic filling pattern. Calculated EF 58%. Moderate (Grade II) mitral regurgitation. Inadequate TR jet to estimate pulmonary artery systolic pressure. Normal right atrial pressure.   Lexiscan Sestamibi Stress Test 09/11/2018: Resting EKG normal sinus rhythm, LVH, occasional PVCs.  Stress EKG nondiagnostic due to pharmacologic stress testing.  Hypertensive throughout the study with resting blood pressure 160/110 mmHg. Myocardial perfusion imaging is normal. Left ventricular ejection fraction is  74% with normal wall motion. Low risk study.  4 day Ziopatch 07/31/2018: Dominant rhythm, Normal sinus rhythm. Episodes of brief atrial fibrillation with longest episode being on 06/17 at 1255 for 2 m 56s 112-182 bpm. Some  episodes reported as A fib are questionable for Atrial tachycardia (06/19 and 06/18). A fib burden <1%. Patient triggered events for fluttering/racing correlated with A   fib. 31 reported episodes of atrial tachycardia with fastest HR 204 bpm on 06/17 at 1437. Avg HR 147 bpm. Longest episode for 8 m on 06/16 at 1505. Rare PAC and PVC with 1 episode ventricular bigeminy for 3.4 s on 06/19.   Renal duplex 08/20/11: No renal artery stenosis. Normal kidney size bilateral.  Assessment:     ICD-10-CM   1. Paroxysmal atrial fibrillation (HCC)  I48.0 EKG 12-Lead  2. Essential hypertension  I10   3. Hyponatremia  E87.1 Basic metabolic panel  4. Pure hypercholesterolemia  E78.00   5. Peeling skin  R23.4    CHA2DS2-VASCScore: Risk Score  2,  Yearly risk of stroke  2.2%. Recommendation: Anticoagulation   EKG 04/22/2019: Sinus rhythm vs. Ectopic atrial rhythm at 72 bpm, normal axis, LVH. No evidence of ischemia.   Recommendations:   Gwendolyn Thompson  is a 58 y.o. female  with hypertension, hyperlipidemia, history of basal cell carcinoma, with brief episodes of A fib and atrial tachycardia on 4 day monitor performed by PCP on 07/31/2018. Since being on medical therapy, she has improvement in heart racing symptoms. She now presents for 3 month follow up. Presents to discuss recent lab results.  I have reviewed and discussed her recent labs, lipids have improved. Continue with current medications. Kidney function and liver enzymes are stable. She is noted to have mild hyponatremia. Will repeat BMP in 1 month for surveillance of this.   Blood pressure has improved with her current medications and has been overall stable. She has not had any known episodes of A fib and only one episode of heart racing since last seen by me. Continue with metoprolol and diltiazem. Although CHADSVASC score is 2, I have recommended anticoagulation as I do feel that she is high risk for cardioembolic event given her hyperlipidemia  and hypertension. (Per ACC guidelines 3 or greater in females should be on anticoagulation). She is aware of bleeding risk with anticoagulation and also prefers to continue with Xarelto. She is noted to have possible ectopic atrial rhythm on EKG that is rate controlled. Will monitor.   She is noted to have bilateral peeling of the skin on her fingertips. No evidence of raynaud's or symptoms suggestive of this. Negative allen's test. Question if related to medication, potentially HCTZ. I will discontinue this and start her on plain Lisinopril 20 mg. She will closely monitor her BP with this. If she does have improvement in skin peeling, may need dermatology evaluation. I will see her back in 8 weeks for follow up, but encouraged her to contact me sooner if needed.   *I have discussed this case with Dr. Tolia and he personally examined the patient and participated in formulating the plan.*    Ashton Haynes Kelley, MSN, APRN, FNP-C [facility] Office: [phone] Fax: [phone] 

## 2019-04-23 MED ORDER — LISINOPRIL 20 MG PO TABS: 20.0000 mg | ORAL_TABLET | Freq: Every day | ORAL | 3 refills | Status: DC

## 2019-05-19 ENCOUNTER — Other Ambulatory Visit: Payer: Self-pay | Admitting: Cardiology

## 2019-06-16 ENCOUNTER — Other Ambulatory Visit: Payer: Self-pay | Admitting: Cardiology

## 2019-06-16 DIAGNOSIS — E875 Hyperkalemia: Secondary | ICD-10-CM

## 2019-06-16 NOTE — Telephone Encounter (Signed)
Please schedule an appointment to see me in 3-4 weeks

## 2019-06-16 NOTE — Telephone Encounter (Signed)
Did you take care of the scheduling issue? I had also sent a message about her potassium to her an to you guys. I will respond to her about her questions.

## 2019-06-16 NOTE — Progress Notes (Signed)
Lab 06/06/2019: Serum glucose 90 mg, BUN 11, creatinine 0.72, EGFR 93 mL, sodium 137, potassium 5.4.  Your potassium levels are up. Please avoid foods rich in  Potassium like banana, Kiwi. Will recheck in 2 weeks and lab orders placed

## 2019-06-17 ENCOUNTER — Telehealth: Payer: Self-pay

## 2019-06-17 ENCOUNTER — Other Ambulatory Visit: Payer: Self-pay

## 2019-06-17 MED ORDER — EZETIMIBE 10 MG PO TABS: 10.0000 mg | ORAL_TABLET | Freq: Every day | ORAL | 1 refills | Status: DC

## 2019-06-17 NOTE — Telephone Encounter (Signed)
Pt aware to keep appt as is. Will go in earlier in the week to have labs done.//ah

## 2019-06-17 NOTE — Telephone Encounter (Signed)
Please sched appt...thanks.

## 2019-06-17 NOTE — Telephone Encounter (Signed)
Called patient to schedule a f/U appt with [facility] [facility] after she get her labs done on 5/28

## 2019-06-24 ENCOUNTER — Ambulatory Visit: Payer: BC Managed Care – PPO | Admitting: Cardiology

## 2019-06-24 ENCOUNTER — Other Ambulatory Visit: Payer: Self-pay

## 2019-06-24 ENCOUNTER — Telehealth: Payer: Self-pay

## 2019-06-24 DIAGNOSIS — I491 Atrial premature depolarization: Secondary | ICD-10-CM

## 2019-06-24 MED ORDER — RIVAROXABAN 20 MG PO TABS: 20.0000 mg | ORAL_TABLET | Freq: Every day | ORAL | 3 refills | Status: DC

## 2019-06-27 LAB — POTASSIUM: Potassium: 4.9 mmol/L (ref 3.5–5.2)

## 2019-07-11 ENCOUNTER — Ambulatory Visit: Payer: BC Managed Care – PPO | Admitting: Cardiology

## 2019-07-18 ENCOUNTER — Other Ambulatory Visit: Payer: Self-pay | Admitting: Cardiology

## 2019-07-31 ENCOUNTER — Telehealth: Payer: Self-pay

## 2019-07-31 ENCOUNTER — Ambulatory Visit: Payer: BC Managed Care – PPO | Admitting: Cardiology

## 2019-07-31 ENCOUNTER — Encounter: Payer: Self-pay | Admitting: Cardiology

## 2019-07-31 ENCOUNTER — Other Ambulatory Visit: Payer: Self-pay

## 2019-07-31 VITALS — BP 173/89 | HR 80 | Ht 60.75 in | Wt 140.4 lb

## 2019-07-31 DIAGNOSIS — I10 Essential (primary) hypertension: Secondary | ICD-10-CM

## 2019-07-31 DIAGNOSIS — E7800 Pure hypercholesterolemia, unspecified: Secondary | ICD-10-CM

## 2019-07-31 DIAGNOSIS — I480 Paroxysmal atrial fibrillation: Secondary | ICD-10-CM

## 2019-07-31 DIAGNOSIS — I340 Nonrheumatic mitral (valve) insufficiency: Secondary | ICD-10-CM

## 2019-07-31 MED ORDER — LISINOPRIL-HYDROCHLOROTHIAZIDE 20-25 MG PO TABS: 1.0000 | ORAL_TABLET | Freq: Every morning | ORAL | 3 refills | Status: DC

## 2019-07-31 NOTE — Progress Notes (Signed)
Primary Physician:  Larene Beach, MD  Patient ID: Gwendolyn Thompson, female    DOB: 1960/12/01, 59 y.o.   MRN: [id number]  Subjective:    Chief Complaint  Patient presents with   Follow-up    labs, discuss lisinopril   Hypertension   Hyperlipidemia    HPI: Gwendolyn Thompson  is a 59 y.o. female  with hypertension, hyperlipidemia, history of basal cell carcinoma, with brief episodes of A fib and atrial tachycardia on 4 day monitor performed by PCP on 07/31/2018. Since being on medical therapy, she has improvement in heart racing symptoms. She now presents for 3 month follow up. Presents to discuss recent lab results, BMP due to change in therapy.     She is a former smoker that quit over 30 years ago. Occasional alcohol use. No illicit drug use.  On her last office visit lisinopril HCT was changed to lisinopril 3 months ago and since then she has noticed her blood pressure to be elevated.  This was done due to skin peeling but states that there has been no change.  She would like to go back to lisinopril HCT.  She is tolerating Lipitor without any side effects. She also had labs done on 06/06/2019 which revealed hypokalemia.  She has been avoiding excessive potassium supplements and foods rich in potassium.  She underwent repeat BMP.  Past Medical History:  Diagnosis Date   Anxious depression    Atrial fibrillation ([place]) 09/17/2018   B12 deficiency-borderline 09/17/2013   Level was 210, July 2015   Cancer [facility])    basal cell removed from nose   Cervical dysplasia    Depression    Esophageal reflux    HTN (hypertension)    Hyperlipidemia    Hyperplastic colon polyp    Palpitations     Past Surgical History:  Procedure Laterality Date   COLONOSCOPY W/ BIOPSIES  08-26-2012   Dr. Glennon Hamilton    COLPOSCOPY     GYNECOLOGIC CRYOSURGERY  1987   MOUTH SURGERY     Social History   Tobacco Use   Smoking status: Former Smoker    Packs/day: 0.25    Years: 3.00     Pack years: 0.75    Types: Cigarettes    Quit date: 08/20/1988    Years since quitting: 30.9   Smokeless tobacco: Never Used  Substance Use Topics   Alcohol use: Yes    Comment: 4-6 DRINKS A WEEK- WINE   Marital Status: Married   Review of Systems  Cardiovascular: Negative for chest pain, dyspnea on exertion and leg swelling.  Gastrointestinal: Negative for melena.   Objective:  Blood pressure (!) 173/89, pulse 80, height 5' 0.75" (1.543 m), weight 140 lb 6.4 oz (63.7 kg), SpO2 96 %. Body mass index is 26.75 kg/m.    Physical Exam Vitals reviewed.  Constitutional:      Appearance: She is well-developed.  Cardiovascular:     Rate and Rhythm: Normal rate and regular rhythm. Frequent extrasystoles are present.    Pulses: Normal pulses and intact distal pulses.     Heart sounds: S1 normal and S2 normal. Murmur heard.  Blowing midsystolic murmur is present with a grade of 2/6 at the apex.   Pulmonary:     Effort: Pulmonary effort is normal. No accessory muscle usage or respiratory distress.     Breath sounds: Normal breath sounds.  Abdominal:     General: Bowel sounds are normal.     Palpations: Abdomen is  soft.    Radiology: No results found.  Laboratory examination:   CMP Latest Ref Rng & Units 06/26/2019 04/11/2019 12/20/2018  Glucose 65 - 99 mg/dL - 91 87  BUN 6 - 24 mg/dL - 9 12  Creatinine 0.57 - 1.00 mg/dL - 0.73 0.75  Sodium 134 - 144 mmol/L - 132(L) 136  Potassium 3.5 - 5.2 mmol/L 4.9 4.6 4.7  Chloride 96 - 106 mmol/L - 92(L) 95(L)  CO2 20 - 29 mmol/L - 21 22  Calcium 8.7 - 10.2 mg/dL - 9.6 10.0  Total Protein 6.0 - 8.5 g/dL - 7.8 8.1  Total Bilirubin 0.0 - 1.2 mg/dL - 0.4 0.5  Alkaline Phos 39 - 117 IU/L - 67 79  AST 0 - 40 IU/L - 44(H) 48(H)  ALT 0 - 32 IU/L - 27 35(H)   CBC Latest Ref Rng & Units 04/14/2010  WBC 4.0 - 10.5 K/uL 6.6  Hemoglobin 12.0 - 15.0 g/dL 13.2  Hematocrit 36 - 46 % 37.1  Platelets 150 - 400 K/uL 268   Lipid Panel     Component  Value Date/Time   CHOL 219 (H) 04/11/2019 0912   TRIG 202 (H) 04/11/2019 0912   HDL 85 04/11/2019 0912   CHOLHDL 2.6 04/11/2019 0912   LDLCALC 100 (H) 04/11/2019 0912   External labs: Lab 06/06/2019: Serum glucose 90 mg, BUN 11, creatinine 0.72, EGFR 93 mL, sodium 137, potassium 5.4.  07/24/2018: Potassium 3.2, creatinine 0.6, EGFR greater than 90, AST 51, ALT normal at 45 (previously AST 74 and ALT 66 in January 2020).  TSH 1.2.  Normal H&H, MCH 33.1, CBC otherwise normal.  02/21/2018: Cholesterol 287, triglycerides 239, HDL 79, LDL 183.   PRN Meds:. Medications Discontinued During This Encounter  Medication Reason   lisinopril-hydrochlorothiazide (ZESTORETIC) 20-25 MG tablet Discontinued by provider   lisinopril (ZESTRIL) 20 MG tablet Change in therapy   Current Meds  Medication Sig   ALPRAZolam (XANAX) 0.5 MG tablet Take 0.5 mg by mouth at bedtime as needed.   AMBULATORY NON FORMULARY MEDICATION 500 mg. AHCC Immune Booster  2 tablets at breakfast    AMBULATORY NON FORMULARY MEDICATION ALJ Herbal Respiratory Booster 4 tablets at breakfast   atorvastatin (LIPITOR) 80 MG tablet Take 1 tablet (80 mg total) by mouth daily.   Calcium Carbonate Antacid 600 MG chewable tablet Chew 1 tablet by mouth as needed for indigestion or heartburn.    Cetirizine HCl (ZYRTEC ALLERGY PO) Take by mouth.   Cholecalciferol (VITAMIN D-3 PO) Take by mouth daily.   Coenzyme Q10 (CO Q 10 PO) Take by mouth.   diltiazem (CARDIZEM CD) 360 MG 24 hr capsule TAKE 1 CAPSULE BY MOUTH EVERY DAY   ezetimibe (ZETIA) 10 MG tablet Take 1 tablet (10 mg total) by mouth daily.   fluticasone (FLONASE) 50 MCG/ACT nasal spray INHALE 1 SPRAY BY NASAL ROUTE DAILY.   metoprolol succinate (TOPROL-XL) 50 MG 24 hr tablet Take 1 tablet (50 mg total) by mouth daily. Take with or immediately following a meal.   OVER THE COUNTER MEDICATION Liver detox   rivaroxaban (XARELTO) 20 MG TABS tablet Take 1 tablet (20 mg  total) by mouth daily.   Turmeric Curcumin 500 MG CAPS Take by mouth.   TURMERIC CURCUMIN PO Take 500 mg by mouth 2 (two) times a day.   venlafaxine (EFFEXOR) 75 MG tablet Take 75 mg by mouth daily.    [DISCONTINUED] lisinopril (ZESTRIL) 20 MG tablet Take 1 tablet (20 mg total) by mouth  daily.    Cardiac Studies:   Echocardiogram 09/05/2018: Moderate concentric hypertrophy of the left ventricle. Left ventricle cavity is normal in size. Normal LV systolic function with EF 58%. Normal global wall motion. Normal diastolic filling pattern. Calculated EF 58%. Moderate (Grade II) mitral regurgitation. Inadequate TR jet to estimate pulmonary artery systolic pressure. Normal right atrial pressure.   Lexiscan Sestamibi Stress Test 09/11/2018: Resting EKG normal sinus rhythm, LVH, occasional PVCs.  Stress EKG nondiagnostic due to pharmacologic stress testing.  Hypertensive throughout the study with resting blood pressure 160/110 mmHg. Myocardial perfusion imaging is normal. Left ventricular ejection fraction is  74% with normal wall motion. Low risk study.  4 day Ziopatch 07/31/2018: Dominant rhythm, Normal sinus rhythm. Episodes of brief atrial fibrillation with longest episode being on 06/17 at 1255 for 2 m 56s 112-182 bpm. Some episodes reported as A fib are questionable for Atrial tachycardia (06/19 and 06/18). A fib burden <1%. Patient triggered events for fluttering/racing correlated with A fib. 31 reported episodes of atrial tachycardia with fastest HR 204 bpm on 06/17 at 1437. Avg HR 147 bpm. Longest episode for 8 m on 06/16 at 1505. Rare PAC and PVC with 1 episode ventricular bigeminy for 3.4 s on 06/19.   Renal duplex 08/20/11: No renal artery stenosis. Normal kidney size bilateral.  EKG:   EKG 04/22/2019: Sinus rhythm vs. Ectopic atrial rhythm at 72 bpm, normal axis, LVH. No evidence of ischemia.   Assessment:     ICD-10-CM   1. Essential hypertension  I10  lisinopril-hydrochlorothiazide (ZESTORETIC) 20-25 MG tablet    Basic metabolic panel  2. Paroxysmal atrial fibrillation ([place]). CHA2DS2-VASc Score is 2.  Yearly risk of stroke: 2.3% (F, HTN).     I48.0   3. Moderate mitral insufficiency  I34.0   4. Pure hypercholesterolemia  E78.00 Lipid Panel With LDL/HDL Ratio   Recommendations:   Gwendolyn Thompson  is a 59 y.o. female  with hypertension, hyperlipidemia, history of basal cell carcinoma, with brief episodes of A fib and atrial tachycardia on 4 day monitor performed by PCP on 07/31/2018. Since being on medical therapy, she has improvement in heart racing symptoms.   She now presents for 3 month follow up of hypertension, hyperlipidemia and paroxysmal atrial fibrillation.  Although has low cardioembolic risk, per patient preference she is on anticoagulation.  Blood pressure has been high since discontinuing lisinopril HCT and switching to lisinopril due to possible reaction and skin peeling in her hands. States no change in skin symptoms and this is chronic skin condition.  Hence I will switch back to lisinopril HCT 20/25 mg daily, will recheck BMP in 2 weeks.  Her external labs in April had revealed hyperkalemia and so I rechecked her BMP, potassium levels are now normalized.  With regard to lipids, she is tolerating atorvastatin, will also check her lipids. Mitral regurgitation is clinically insignificant and will consider repeat echo in a year.   She has frequent ectopy on auscultation but maintains sinus rhythm.  Otherwise remained stable from cardiac standpoint, we will continue to monitor her blood pressure and goal blood pressure is <130/80 mmHg, she will contact us if it is higher.  I will see her back in 6 months for follow-up.  Adrian Prows, MD, [facility] 08/02/2019, 5:01 PM [facility]. [place] Office: [phone]

## 2019-07-31 NOTE — Telephone Encounter (Signed)
Informed patient that she had reached her 3,400 maxium program benefit for the care path savings program. Informed patient that she will need to call Alphonsa Overall and apply for the SELECT program. She verbalized understanding.

## 2019-08-08 ENCOUNTER — Other Ambulatory Visit: Payer: Self-pay

## 2019-08-08 DIAGNOSIS — I491 Atrial premature depolarization: Secondary | ICD-10-CM

## 2019-08-08 MED ORDER — RIVAROXABAN 20 MG PO TABS: 20.0000 mg | ORAL_TABLET | Freq: Every day | ORAL | 12 refills | Status: DC

## 2019-08-29 NOTE — Telephone Encounter (Signed)
From patient.

## 2019-09-19 LAB — LIPID PANEL WITH LDL/HDL RATIO
Cholesterol, Total: 214 mg/dL — ABNORMAL HIGH (ref 100–199)
HDL: 65 mg/dL (ref >39)
LDL Chol Calc (NIH): 113 mg/dL — ABNORMAL HIGH (ref 0–99)
LDL/HDL Ratio: 1.7 ratio (ref 0.0–3.2)
Triglycerides: 207 mg/dL — ABNORMAL HIGH (ref 0–149)
VLDL Cholesterol Cal: 36 mg/dL (ref 5–40)

## 2019-09-19 LAB — BASIC METABOLIC PANEL
BUN/Creatinine Ratio: 15 (ref 9–23)
BUN: 9 mg/dL (ref 6–24)
CO2: 25 mmol/L (ref 20–29)
Calcium: 9.4 mg/dL (ref 8.7–10.2)
Chloride: 88 mmol/L — ABNORMAL LOW (ref 96–106)
Creatinine, Ser: 0.61 mg/dL (ref 0.57–1.00)
GFR calc Af Amer: 116 mL/min/{1.73_m2} (ref >59)
GFR calc non Af Amer: 101 mL/min/{1.73_m2} (ref >59)
Glucose: 94 mg/dL (ref 65–99)
Potassium: 4.5 mmol/L (ref 3.5–5.2)
Sodium: 131 mmol/L — ABNORMAL LOW (ref 134–144)

## 2019-10-12 ENCOUNTER — Other Ambulatory Visit: Payer: Self-pay | Admitting: Cardiology

## 2019-10-27 ENCOUNTER — Other Ambulatory Visit: Payer: Self-pay

## 2019-10-27 DIAGNOSIS — I10 Essential (primary) hypertension: Secondary | ICD-10-CM

## 2019-10-27 MED ORDER — METOPROLOL SUCCINATE ER 50 MG PO TB24: 50.0000 mg | ORAL_TABLET | Freq: Every day | ORAL | 2 refills | Status: DC

## 2019-12-05 ENCOUNTER — Other Ambulatory Visit: Payer: Self-pay | Admitting: Cardiology

## 2019-12-23 ENCOUNTER — Other Ambulatory Visit: Payer: Self-pay | Admitting: Cardiology

## 2019-12-23 DIAGNOSIS — I10 Essential (primary) hypertension: Secondary | ICD-10-CM

## 2019-12-24 ENCOUNTER — Other Ambulatory Visit: Payer: Self-pay | Admitting: Cardiology

## 2020-01-19 ENCOUNTER — Other Ambulatory Visit: Payer: Self-pay | Admitting: Obstetrics & Gynecology

## 2020-01-19 DIAGNOSIS — Z1231 Encounter for screening mammogram for malignant neoplasm of breast: Secondary | ICD-10-CM

## 2020-01-30 ENCOUNTER — Ambulatory Visit: Payer: BC Managed Care – PPO | Admitting: Cardiology

## 2020-02-24 ENCOUNTER — Encounter: Payer: Self-pay | Admitting: Cardiology

## 2020-02-24 ENCOUNTER — Ambulatory Visit: Payer: BC Managed Care – PPO | Admitting: Cardiology

## 2020-02-24 ENCOUNTER — Other Ambulatory Visit: Payer: Self-pay

## 2020-02-24 VITALS — BP 142/80 | HR 89 | Resp 16 | Ht 60.0 in | Wt 138.0 lb

## 2020-02-24 DIAGNOSIS — I10 Essential (primary) hypertension: Secondary | ICD-10-CM

## 2020-02-24 DIAGNOSIS — I480 Paroxysmal atrial fibrillation: Secondary | ICD-10-CM

## 2020-02-24 DIAGNOSIS — E7800 Pure hypercholesterolemia, unspecified: Secondary | ICD-10-CM

## 2020-02-24 DIAGNOSIS — I340 Nonrheumatic mitral (valve) insufficiency: Secondary | ICD-10-CM

## 2020-02-24 MED ORDER — METOPROLOL SUCCINATE ER 100 MG PO TB24: 100.0000 mg | ORAL_TABLET | Freq: Every day | ORAL | 3 refills | Status: DC

## 2020-02-24 NOTE — Progress Notes (Signed)
Primary Physician:  Larene Beach, MD  Patient ID: Gwendolyn Thompson, female    DOB: 1960-12-22, 60 y.o.   MRN: [id number]  Subjective:    Chief Complaint  Patient presents with  . Hypertension  . Hyperlipidemia  . Mitral Regurgitation  . Follow-up    HPI: Gwendolyn Thompson  is a 60 y.o. female  with hypertension, hyperlipidemia, history of basal cell carcinoma, with brief episodes of A fib and atrial tachycardia on 4 day monitor performed by PCP on 07/31/2018. Since being on medical therapy, she has improvement in heart racing symptoms. She now presents for 3 month follow up. Presents to discuss recent lab results, BMP due to change in therapy.     She is a former smoker that quit over 30 years ago. Occasional alcohol use. No illicit drug use.  On her last office visit lisinopril HCT was changed to lisinopril 3 months ago and since then she has noticed her blood pressure to be elevated.  This was done due to skin peeling but states that there has been no change.  She would like to go back to lisinopril HCT.  She is tolerating Lipitor without any side effects. She also had labs done on 06/06/2019 which revealed hypokalemia.  She has been avoiding excessive potassium supplements and foods rich in potassium.  She underwent repeat BMP.  Past Medical History:  Diagnosis Date  . Anxious depression   . Atrial fibrillation ([place]) 09/17/2018  . B12 deficiency-borderline 09/17/2013   Level was 210, July 2015  . Cancer ([place])    basal cell removed from nose  . Cervical dysplasia   . Depression   . Esophageal reflux   . HTN (hypertension)   . Hyperlipidemia   . Hyperplastic colon polyp   . Palpitations     Past Surgical History:  Procedure Laterality Date  . COLONOSCOPY W/ BIOPSIES  08-26-2012   Dr. Glennon Hamilton   . COLPOSCOPY    . GYNECOLOGIC CRYOSURGERY  1987  . MOUTH SURGERY     Social History   Tobacco Use  . Smoking status: Former Smoker    Packs/day: 0.25    Years: 3.00     Pack years: 0.75    Types: Cigarettes    Quit date: 08/20/1988    Years since quitting: 31.5  . Smokeless tobacco: Never Used  Substance Use Topics  . Alcohol use: Yes    Comment: 4-6 DRINKS A WEEK- WINE   Marital Status: Married   Review of Systems  Cardiovascular: Negative for chest pain, dyspnea on exertion and leg swelling.  Gastrointestinal: Negative for melena.   Objective:  Blood pressure (!) 142/80, pulse 89, resp. rate 16, height 5' (1.524 m), weight 138 lb (62.6 kg), SpO2 99 %. Body mass index is 26.95 kg/m.   Vitals with BMI 02/24/2020 07/31/2019 07/31/2019  Height '5\' 0"'  - -  Weight 138 lbs - -  BMI 59.93 - -  Systolic 570 177 939  Diastolic 80 89 94  Pulse 89 80 -       Physical Exam Vitals reviewed.  Constitutional:      Appearance: She is well-developed.  Cardiovascular:     Rate and Rhythm: Normal rate. Rhythm irregular. Frequent extrasystoles are present.    Pulses: Normal pulses and intact distal pulses.     Heart sounds: S1 normal and S2 normal. Murmur heard.   Blowing midsystolic murmur is present with a grade of 2/6 at the apex.   Pulmonary:  Effort: Pulmonary effort is normal. No accessory muscle usage or respiratory distress.     Breath sounds: Normal breath sounds.  Abdominal:     General: Bowel sounds are normal.     Palpations: Abdomen is soft.    Radiology: No results found.  Laboratory examination:   CMP Latest Ref Rng & Units 09/18/2019 06/26/2019 04/11/2019  Glucose 65 - 99 mg/dL 94 - 91  BUN 6 - 24 mg/dL 9 - 9  Creatinine 0.57 - 1.00 mg/dL 0.61 - 0.73  Sodium 134 - 144 mmol/L 131(L) - 132(L)  Potassium 3.5 - 5.2 mmol/L 4.5 4.9 4.6  Chloride 96 - 106 mmol/L 88(L) - 92(L)  CO2 20 - 29 mmol/L 25 - 21  Calcium 8.7 - 10.2 mg/dL 9.4 - 9.6  Total Protein 6.0 - 8.5 g/dL - - 7.8  Total Bilirubin 0.0 - 1.2 mg/dL - - 0.4  Alkaline Phos 39 - 117 IU/L - - 67  AST 0 - 40 IU/L - - 44(H)  ALT 0 - 32 IU/L - - 27   CBC Latest Ref Rng & Units  04/14/2010  WBC 4.0 - 10.5 K/uL 6.6  Hemoglobin 12.0 - 15.0 g/dL 13.2  Hematocrit 36.0 - 46.0 % 37.1  Platelets 150 - 400 K/uL 268   Lipid Panel     Component Value Date/Time   CHOL 214 (H) 09/18/2019 0837   TRIG 207 (H) 09/18/2019 0837   HDL 65 09/18/2019 0837   CHOLHDL 2.6 04/11/2019 0912   LDLCALC 113 (H) 09/18/2019 0837   External labs:   Lab 06/06/2019: Serum glucose 90 mg, BUN 11, creatinine 0.72, EGFR 93 mL, sodium 137, potassium 5.4.  07/24/2018: Potassium 3.2, creatinine 0.6, EGFR greater than 90, AST 51, ALT normal at 45 (previously AST 74 and ALT 66 in January 2020).  TSH 1.2.  Normal H&H, MCH 33.1, CBC otherwise normal.  02/21/2018: Cholesterol 287, triglycerides 239, HDL 79, LDL 183.  Current Outpatient Medications on File Prior to Visit  Medication Sig Dispense Refill  . ALPRAZolam (XANAX) 0.5 MG tablet Take 0.5 mg by mouth at bedtime as needed.    . AMBULATORY NON FORMULARY MEDICATION 500 mg. AHCC Immune Booster  2 tablets at breakfast    . AMBULATORY NON FORMULARY MEDICATION ALJ Herbal Respiratory Booster 4 tablets at breakfast    . atorvastatin (LIPITOR) 80 MG tablet TAKE 1 TABLET BY MOUTH EVERY DAY 90 tablet 3  . Calcium Carbonate Antacid 600 MG chewable tablet Chew 1 tablet by mouth as needed for indigestion or heartburn.     . Cetirizine HCl (ZYRTEC ALLERGY PO) Take by mouth.    . Cholecalciferol (VITAMIN D-3 PO) Take by mouth daily.    . Coenzyme Q10 (CO Q 10 PO) Take by mouth.    . diltiazem (CARDIZEM CD) 360 MG 24 hr capsule TAKE 1 CAPSULE BY MOUTH EVERY DAY 90 capsule 1  . ezetimibe (ZETIA) 10 MG tablet TAKE 1 TABLET BY MOUTH EVERY DAY 90 tablet 1  . fluticasone (FLONASE) 50 MCG/ACT nasal spray INHALE 1 SPRAY BY NASAL ROUTE DAILY.    Marland Kitchen lisinopril-hydrochlorothiazide (ZESTORETIC) 20-25 MG tablet TAKE 1 TABLET BY MOUTH EVERY DAY IN THE MORNING 90 tablet 1  . OVER THE COUNTER MEDICATION Liver detox    . rivaroxaban (XARELTO) 20 MG TABS tablet Take 1 tablet  (20 mg total) by mouth daily. 30 tablet 12  . TURMERIC CURCUMIN PO Take 500 mg by mouth 2 (two) times a day.    . venlafaxine (  EFFEXOR) 75 MG tablet Take 75 mg by mouth daily.      No current facility-administered medications on file prior to visit.     Cardiac Studies:   Echocardiogram 09/05/2018: Moderate concentric hypertrophy of the left ventricle. Left ventricle cavity is normal in size. Normal LV systolic function with EF 58%. Normal global wall motion. Normal diastolic filling pattern. Calculated EF 58%. Moderate (Grade II) mitral regurgitation. Inadequate TR jet to estimate pulmonary artery systolic pressure. Normal right atrial pressure.   Lexiscan Sestamibi Stress Test 09/11/2018: Resting EKG normal sinus rhythm, LVH, occasional PVCs.  Stress EKG nondiagnostic due to pharmacologic stress testing.  Hypertensive throughout the study with resting blood pressure 160/110 mmHg. Myocardial perfusion imaging is normal. Left ventricular ejection fraction is  74% with normal wall motion. Low risk study.  4 day Ziopatch 07/31/2018: Dominant rhythm, Normal sinus rhythm. Episodes of brief atrial fibrillation with longest episode being on 06/17 at 1255 for 2 m 56s 112-182 bpm. Some episodes reported as A fib are questionable for Atrial tachycardia (06/19 and 06/18). A fib burden <1%. Patient triggered events for fluttering/racing correlated with A fib. 31 reported episodes of atrial tachycardia with fastest HR 204 bpm on 06/17 at 1437. Avg HR 147 bpm. Longest episode for 8 m on 06/16 at 1505. Rare PAC and PVC with 1 episode ventricular bigeminy for 3.4 s on 06/19.   Renal duplex 08/20/11: No renal artery stenosis. Normal kidney size bilateral.  EKG:   EKG 04/22/2019: Sinus rhythm vs. Ectopic atrial rhythm at 72 bpm, normal axis, LVH. No evidence of ischemia.   Assessment:     ICD-10-CM   1. Moderate mitral insufficiency  I34.0 EKG 12-Lead    PCV ECHOCARDIOGRAM COMPLETE  2. Paroxysmal atrial  fibrillation ([place]). CHA2DS2-VASc Score is 2.  Yearly risk of stroke: 2.3% (F, HTN).     I48.0 PCV ECHOCARDIOGRAM COMPLETE    metoprolol succinate (TOPROL-XL) 100 MG 24 hr tablet    CANCELED: LONG TERM MONITOR (3-14 DAYS)  3. Essential hypertension  I10 metoprolol succinate (TOPROL-XL) 100 MG 24 hr tablet   Meds ordered this encounter  Medications  . metoprolol succinate (TOPROL-XL) 100 MG 24 hr tablet    Sig: Take 1 tablet (100 mg total) by mouth daily. Take with or immediately following a meal.    Dispense:  90 tablet    Refill:  3    Medications Discontinued During This Encounter  Medication Reason  . Turmeric Curcumin 211 MG CAPS Duplicate  . metoprolol succinate (TOPROL-XL) 50 MG 24 hr tablet Reorder    Recommendations:   Gwendolyn Thompson  is a 60 y.o. female  with hypertension, hyperlipidemia, history of basal cell carcinoma, with brief episodes of A fib and atrial tachycardia on 4 day monitor performed by PCP on 07/31/2018. Since being on medical therapy, she has improvement in heart racing symptoms.   She now presents for 6 month follow up of hypertension, hyperlipidemia and paroxysmal atrial fibrillation.  Although has low cardioembolic risk, per patient preference she is on anticoagulation. Today she is in AF. She is asymptomatic. Will increase Metoprolol Succinate to 100 mg daily. She is also on Diltiazem and tolerating well. BP is also slightly higher.   Blood pressure has been high since discontinuing lisinopril HCT and switching to lisinopril due to possible reaction and skin peeling in her hands. States no change in skin symptoms and this is chronic skin condition.  Hence I will switch back to lisinopril HCT 20/25 mg daily, will  recheck BMP in 2 weeks.  Her external labs in April had revealed hyperkalemia and so I rechecked her BMP, potassium levels are now normalized.  With regard to lipids, she is tolerating atorvastatin, will also check her lipids. Repeat echo for LVEF  and f/u mitral regurgitation. I will see her back in 4 weeks and if in persistent atrial fibrillation, will consider DCCV.    Adrian Prows, MD, [facility] 02/25/2020, 7:39 AM Office: [phone] Pager: [phone]

## 2020-02-25 ENCOUNTER — Ambulatory Visit (INDEPENDENT_AMBULATORY_CARE_PROVIDER_SITE_OTHER): Payer: BC Managed Care – PPO | Admitting: Obstetrics & Gynecology

## 2020-02-25 ENCOUNTER — Encounter: Payer: Self-pay | Admitting: Obstetrics & Gynecology

## 2020-02-25 VITALS — BP 120/80 | Ht 60.5 in | Wt 136.0 lb

## 2020-02-25 DIAGNOSIS — Z780 Asymptomatic menopausal state: Secondary | ICD-10-CM

## 2020-02-25 DIAGNOSIS — Z01419 Encounter for gynecological examination (general) (routine) without abnormal findings: Secondary | ICD-10-CM | POA: Diagnosis not present

## 2020-02-25 NOTE — Telephone Encounter (Signed)
Please schedule her in first week of Feb or 2nd week

## 2020-02-25 NOTE — Progress Notes (Signed)
    Gwendolyn Thompson [birth year] [id number]   History:    60 y.o. G0 Same sex wife  [id number] presenting for annual gyn exam   RWE:[id number],KDTO on no hormone replacement therapy. No postmenopausal bleeding. No pelvic pain.  Pap negative 2018. Urine and bowel movements normal. Breasts normal.  Screening Mammo scheduled.Body mass index 26.12.  Walking daily. Health labs with family physician.  Diagnosed with A. fib in 2020, followed by cardio.  Colonoscopy July 2014, polyp removed.  BD normal 04/2017.   Past medical history,surgical history, family history and social history were all reviewed and documented in the EPIC chart.  Gynecologic History No LMP recorded. Patient is postmenopausal.  Obstetric History OB History  Gravida Para Term Preterm AB Living  0            SAB IAB Ectopic Multiple Live Births                ROS: A ROS was performed and pertinent positives and negatives are included in the history.  GENERAL: No fevers or chills. HEENT: No change in vision, no earache, sore throat or sinus congestion. NECK: No pain or stiffness. CARDIOVASCULAR: No chest pain or pressure. No palpitations. PULMONARY: No shortness of breath, cough or wheeze. GASTROINTESTINAL: No abdominal pain, nausea, vomiting or diarrhea, melena or bright red blood per rectum. GENITOURINARY: No urinary frequency, urgency, hesitancy or dysuria. MUSCULOSKELETAL: No joint or muscle pain, no back pain, no recent trauma. DERMATOLOGIC: No rash, no itching, no lesions. ENDOCRINE: No polyuria, polydipsia, no heat or cold intolerance. No recent change in weight. HEMATOLOGICAL: No anemia or easy bruising or bleeding. NEUROLOGIC: No headache, seizures, numbness, tingling or weakness. PSYCHIATRIC: No depression, no loss of interest in normal activity or change in sleep pattern.     Exam:   BP 120/80   Ht 5' 0.5" (1.537 m)   Wt 136 lb (61.7 kg)   BMI 26.12 kg/m   Body mass index is  26.12 kg/m.  General appearance : Well developed well nourished female. No acute distress HEENT: Eyes: no retinal hemorrhage or exudates,  Neck supple, trachea midline, no carotid bruits, no thyroidmegaly Lungs: Clear to auscultation, no rhonchi or wheezes, or rib retractions  Heart: Regular rate and rhythm, no murmurs or gallops Breast:Examined in sitting and supine position were symmetrical in appearance, no palpable masses or tenderness,  no skin retraction, no nipple inversion, no nipple discharge, no skin discoloration, no axillary or supraclavicular lymphadenopathy Abdomen: no palpable masses or tenderness, no rebound or guarding Extremities: no edema or skin discoloration or tenderness  Pelvic: Vulva: Normal             Vagina: No gross lesions or discharge  Cervix: No gross lesions or discharge.  Pap reflex done.  Uterus AV, normal size, shape and consistency, non-tender and mobile  Adnexa  Without masses or tenderness  Anus: Normal   Assessment/Plan:  60 y.o. female for annual exam   1. Encounter for routine gynecological examination with Papanicolaou smear of cervix Normal gynecologic exam in menopause.  Pap reflex done.  Breast exam normal.  Screening mammo scheduled for this Friday.  Will schedule a Colonoscopy this year. BMI 26.12.  Continue with fitness and healthy nutrition.  Health labs with Fam MD.  2. Postmenopausal Well on no HRT.  No PMB.  Bone Density normal 04/2017.  Vit D, Ca++ 1500 mg daily.  Princess Bruins MD, 2:23 PM 02/25/2020

## 2020-02-26 ENCOUNTER — Ambulatory Visit
Admission: RE | Admit: 2020-02-26 | Discharge: 2020-02-26 | Disposition: A | Discharge status: Home / Self Care | Payer: BC Managed Care – PPO | Source: Ambulatory Visit | Attending: Obstetrics & Gynecology | Admitting: Obstetrics & Gynecology

## 2020-02-26 ENCOUNTER — Other Ambulatory Visit: Payer: Self-pay

## 2020-02-26 ENCOUNTER — Ambulatory Visit: Payer: BC Managed Care – PPO

## 2020-02-26 DIAGNOSIS — I340 Nonrheumatic mitral (valve) insufficiency: Secondary | ICD-10-CM

## 2020-02-26 DIAGNOSIS — I480 Paroxysmal atrial fibrillation: Secondary | ICD-10-CM

## 2020-02-26 DIAGNOSIS — Z1231 Encounter for screening mammogram for malignant neoplasm of breast: Secondary | ICD-10-CM

## 2020-02-26 NOTE — Telephone Encounter (Signed)
From Dr. Einar Gip

## 2020-02-26 NOTE — Telephone Encounter (Signed)
From patient - Last part about the echo results.

## 2020-02-27 ENCOUNTER — Ambulatory Visit: Payer: BC Managed Care – PPO

## 2020-02-27 LAB — PAP IG W/ RFLX HPV ASCU

## 2020-03-03 ENCOUNTER — Other Ambulatory Visit: Payer: Self-pay

## 2020-03-03 DIAGNOSIS — I491 Atrial premature depolarization: Secondary | ICD-10-CM

## 2020-03-03 MED ORDER — RIVAROXABAN 20 MG PO TABS: 20.0000 mg | ORAL_TABLET | Freq: Every day | ORAL | 12 refills | Status: DC

## 2020-03-04 NOTE — Telephone Encounter (Signed)
From pt

## 2020-03-09 ENCOUNTER — Other Ambulatory Visit: Payer: BC Managed Care – PPO

## 2020-03-31 ENCOUNTER — Ambulatory Visit: Payer: BC Managed Care – PPO | Admitting: Cardiology

## 2020-03-31 NOTE — Telephone Encounter (Signed)
From pt

## 2020-04-01 ENCOUNTER — Encounter: Payer: Self-pay | Admitting: Cardiology

## 2020-04-01 ENCOUNTER — Other Ambulatory Visit: Payer: Self-pay

## 2020-04-01 ENCOUNTER — Ambulatory Visit: Payer: BC Managed Care – PPO | Admitting: Cardiology

## 2020-04-01 VITALS — BP 149/92 | HR 86 | Temp 97.2°F | Resp 18 | Ht 60.5 in | Wt 138.4 lb

## 2020-04-01 DIAGNOSIS — I480 Paroxysmal atrial fibrillation: Secondary | ICD-10-CM

## 2020-04-01 DIAGNOSIS — I10 Essential (primary) hypertension: Secondary | ICD-10-CM

## 2020-04-01 LAB — LIPID PANEL WITH LDL/HDL RATIO
Cholesterol, Total: 207 mg/dL — ABNORMAL HIGH (ref 100–199)
HDL: 68 mg/dL (ref >39)
LDL Chol Calc (NIH): 106 mg/dL — ABNORMAL HIGH (ref 0–99)
LDL/HDL Ratio: 1.6 ratio (ref 0.0–3.2)
Triglycerides: 191 mg/dL — ABNORMAL HIGH (ref 0–149)
VLDL Cholesterol Cal: 33 mg/dL (ref 5–40)

## 2020-04-01 MED ORDER — OLMESARTAN MEDOXOMIL-HCTZ 40-25 MG PO TABS: 1.0000 | ORAL_TABLET | ORAL | 3 refills | Status: DC

## 2020-04-01 MED ORDER — FLECAINIDE ACETATE 100 MG PO TABS: 100.0000 mg | ORAL_TABLET | Freq: Two times a day (BID) | ORAL | 1 refills | Status: DC

## 2020-04-01 NOTE — Progress Notes (Signed)
Primary Physician:  Larene Beach, MD  Patient ID: Gwendolyn Thompson, female    DOB: September 29, 1960, 60 y.o.   MRN: [id number]  Subjective:    Chief Complaint  Patient presents with  . Atrial Fibrillation  . Hypertension  . Hyperlipidemia    HPI: Gwendolyn Thompson  is a 60 y.o. female  with hypertension, hyperlipidemia, history of basal cell carcinoma, with brief episodes of A fib and atrial tachycardia on 4 day monitor performed by PCP on 07/31/2018.  She was seen by me on 02/24/2020 and patient was back in atrial fibrillation although asymptomatic.  She is a former smoker that quit over 30 years ago. Occasional alcohol use. No illicit drug use.  She now presents for follow-up of hypertension and atrial fibrillation.  She has been on chronic anticoagulation with Xarelto.  No specific complaints today but admits to not eating healthy or exercising.  Denies chest pain or dyspnea.  Denies palpitations, dizziness or syncope.  Past Medical History:  Diagnosis Date  . Anxious depression   . Atrial fibrillation ([place]) 09/17/2018  . B12 deficiency-borderline 09/17/2013   Level was 210, July 2015  . Cancer ([place])    basal cell removed from nose  . Cervical dysplasia   . Depression   . Esophageal reflux   . HTN (hypertension)   . Hyperlipidemia   . Hyperplastic colon polyp   . Palpitations     Past Surgical History:  Procedure Laterality Date  . COLONOSCOPY W/ BIOPSIES  08-26-2012   Dr. Glennon Hamilton   . COLPOSCOPY    . GYNECOLOGIC CRYOSURGERY  1987  . MOUTH SURGERY     Social History   Tobacco Use  . Smoking status: Former Smoker    Packs/day: 0.25    Years: 3.00    Pack years: 0.75    Types: Cigarettes    Quit date: 08/20/1988    Years since quitting: 31.6  . Smokeless tobacco: Never Used  Substance Use Topics  . Alcohol use: Yes    Comment: 4-6 DRINKS A WEEK- WINE   Marital Status: Married   Review of Systems  Cardiovascular: Negative for chest pain, dyspnea on exertion and  leg swelling.  Gastrointestinal: Negative for melena.   Objective:  Blood pressure (!) 149/92, pulse 86, temperature (!) 97.2 F (36.2 C), temperature source Temporal, resp. rate 18, height 5' 0.5" (1.537 m), weight 138 lb 6.4 oz (62.8 kg), SpO2 98 %. Body mass index is 26.58 kg/m.   Vitals with BMI 04/01/2020 04/01/2020 02/25/2020  Height - 5' 0.5" 5' 0.5"  Weight - 138 lbs 6 oz 136 lbs  BMI - 93.81 01.75  Systolic 102 585 277  Diastolic 92 99 80  Pulse - 86 -       Physical Exam Vitals reviewed.  Constitutional:      Appearance: She is well-developed.  Cardiovascular:     Rate and Rhythm: Normal rate. Rhythm irregular. Frequent extrasystoles are present.    Pulses: Normal pulses and intact distal pulses.     Heart sounds: S1 normal and S2 normal. Murmur heard.   Blowing midsystolic murmur is present with a grade of 2/6 at the apex.   Pulmonary:     Effort: Pulmonary effort is normal. No accessory muscle usage or respiratory distress.     Breath sounds: Normal breath sounds.  Abdominal:     General: Bowel sounds are normal.     Palpations: Abdomen is soft.    Radiology: No results found.  Laboratory  examination:   CMP Latest Ref Rng & Units 09/18/2019 06/26/2019 04/11/2019  Glucose 65 - 99 mg/dL 94 - 91  BUN 6 - 24 mg/dL 9 - 9  Creatinine 0.57 - 1.00 mg/dL 0.61 - 0.73  Sodium 134 - 144 mmol/L 131(L) - 132(L)  Potassium 3.5 - 5.2 mmol/L 4.5 4.9 4.6  Chloride 96 - 106 mmol/L 88(L) - 92(L)  CO2 20 - 29 mmol/L 25 - 21  Calcium 8.7 - 10.2 mg/dL 9.4 - 9.6  Total Protein 6.0 - 8.5 g/dL - - 7.8  Total Bilirubin 0.0 - 1.2 mg/dL - - 0.4  Alkaline Phos 39 - 117 IU/L - - 67  AST 0 - 40 IU/L - - 44(H)  ALT 0 - 32 IU/L - - 27   CBC Latest Ref Rng & Units 04/14/2010  WBC 4.0 - 10.5 K/uL 6.6  Hemoglobin 12.0 - 15.0 g/dL 13.2  Hematocrit 36.0 - 46.0 % 37.1  Platelets 150 - 400 K/uL 268   Lipid Panel Recent Labs    04/11/19 0912 09/18/19 0837 03/31/20 0901  CHOL 219* 214*  207*  TRIG 202* 207* 191*  LDLCALC 100* 113* 106*  HDL 85 65 68  CHOLHDL 2.6  --   --     External labs:   Lab 06/06/2019: Serum glucose 90 mg, BUN 11, creatinine 0.72, EGFR 93 mL, sodium 137, potassium 5.4.  07/24/2018: Potassium 3.2, creatinine 0.6, EGFR greater than 90, AST 51, ALT normal at 45 (previously AST 74 and ALT 66 in January 2020).  TSH 1.2.  Normal H&H, MCH 33.1, CBC otherwise normal.  02/21/2018: Cholesterol 287, triglycerides 239, HDL 79, LDL 183.  Current Outpatient Medications on File Prior to Visit  Medication Sig Dispense Refill  . ALPRAZolam (XANAX) 0.5 MG tablet Take 0.5 mg by mouth at bedtime as needed.    . AMBULATORY NON FORMULARY MEDICATION 500 mg. AHCC Immune Booster  2 tablets at breakfast    . AMBULATORY NON FORMULARY MEDICATION ALJ Herbal Respiratory Booster 4 tablets at breakfast    . atorvastatin (LIPITOR) 80 MG tablet TAKE 1 TABLET BY MOUTH EVERY DAY 90 tablet 3  . Calcium Carbonate Antacid 600 MG chewable tablet Chew 1 tablet by mouth as needed for indigestion or heartburn.     . Cetirizine HCl (ZYRTEC ALLERGY PO) Take by mouth.    . Cholecalciferol (VITAMIN D-3 PO) Take by mouth daily.    . Coenzyme Q10 (CO Q 10 PO) Take by mouth.    . diltiazem (CARDIZEM CD) 360 MG 24 hr capsule TAKE 1 CAPSULE BY MOUTH EVERY DAY 90 capsule 1  . ezetimibe (ZETIA) 10 MG tablet TAKE 1 TABLET BY MOUTH EVERY DAY 90 tablet 1  . fluticasone (FLONASE) 50 MCG/ACT nasal spray INHALE 1 SPRAY BY NASAL ROUTE DAILY.    . metoprolol succinate (TOPROL-XL) 100 MG 24 hr tablet Take 1 tablet (100 mg total) by mouth daily. Take with or immediately following a meal. (Patient taking differently: Take 100 mg by mouth daily. Take with or immediately following a meal. 2 (1/2)40m tablets daily) 90 tablet 3  . OVER THE COUNTER MEDICATION Liver detox    . rivaroxaban (XARELTO) 20 MG TABS tablet Take 1 tablet (20 mg total) by mouth daily. 30 tablet 12  . TURMERIC CURCUMIN PO Take 500 mg by  mouth 2 (two) times a day.    . venlafaxine (EFFEXOR) 75 MG tablet Take 75 mg by mouth daily.      No current facility-administered medications  on file prior to visit.     Cardiac Studies:   Lexiscan Sestamibi Stress Test 09/11/2018: Resting EKG normal sinus rhythm, LVH, occasional PVCs.  Stress EKG nondiagnostic due to pharmacologic stress testing.  Hypertensive throughout the study with resting blood pressure 160/110 mmHg. Myocardial perfusion imaging is normal. Left ventricular ejection fraction is  74% with normal wall motion. Low risk study.  4 day Ziopatch 07/31/2018: Dominant rhythm, Normal sinus rhythm. Episodes of brief atrial fibrillation with longest episode being on 06/17 at 1255 for 2 m 56s 112-182 bpm. Some episodes reported as A fib are questionable for Atrial tachycardia (06/19 and 06/18). A fib burden <1%. Patient triggered events for fluttering/racing correlated with A fib. 31 reported episodes of atrial tachycardia with fastest HR 204 bpm on 06/17 at 1437. Avg HR 147 bpm. Longest episode for 8 m on 06/16 at 1505. Rare PAC and PVC with 1 episode ventricular bigeminy for 3.4 s on 06/19.   Echocardiogram 02/26/2020: 1. Normal LV systolic function with EF 68%. Left ventricle cavity is normal in size. Normal global wall motion. Unable to evaluate diastolic function due to atrial fibrillation. Calculated EF 68%. 2. Left atrial cavity is severely dilated at 4.8 cm. 3. Structurally normal mitral valve. Mild (Grade I) mitral regurgitation. 4. Compared to 08/21/2011, atrial fibrillation is new.   Renal duplex 08/20/11: No renal artery stenosis. Normal kidney size bilateral.  EKG:    EKG 04/01/2020: Atrial fibrillation with controlled ventricular response at the rate of 92 beats minute, normal axis.  LVH with repolarization abnormality, cannot exclude lateral ischemia.    EKG 04/22/2019: Sinus rhythm vs. Ectopic atrial rhythm at 72 bpm, normal axis, LVH. No evidence of ischemia.    Assessment:     ICD-10-CM   1. Paroxysmal atrial fibrillation (HCC)  I48.0 EKG 12-Lead    flecainide (TAMBOCOR) 100 MG tablet  2. Resistant hypertension  I10 PCV RENAL/RENAL ARTERY DUPLEX COMPLETE    Basic metabolic panel   Meds ordered this encounter  Medications  . olmesartan-hydrochlorothiazide (BENICAR HCT) 40-25 MG tablet    Sig: Take 1 tablet by mouth every morning.    Dispense:  90 tablet    Refill:  3    Discontinue lisinopril HCT  . flecainide (TAMBOCOR) 100 MG tablet    Sig: Take 1 tablet (100 mg total) by mouth 2 (two) times daily.    Dispense:  60 tablet    Refill:  1    Medications Discontinued During This Encounter  Medication Reason  . lisinopril-hydrochlorothiazide (ZESTORETIC) 20-25 MG tablet Change in therapy    Recommendations:   Gwendolyn Thompson  is a 60 y.o. with hypertension, hyperlipidemia, history of basal cell carcinoma, with brief episodes of A fib and atrial tachycardia on 4 day monitor performed by PCP on 07/31/2018.  She was seen by me on 02/24/2020 and patient was back in atrial fibrillation although asymptomatic.  She is a former smoker that quit over 30 years ago. Occasional alcohol use. No illicit drug use.  She is essentially asymptomatic with regard to atrial fibrillation however due to long term effects on diastolic function, especially in view of hypertension and moderate LVH, would prefer to maintain sinus rhythm.  I will start the patient on flecainide 100 mg p.o. twice daily.  As she has been on anticoagulation, I will schedule her for direct-current cardioversion.     Blood pressure is not well controlled in spite of aggressive medical therapy.  I will obtain renal artery duplex although in  2013 she had normal renal artery duplex.  I would like to exclude FMD or atherosclerotic renal artery stenosis.  I will discontinue lisinopril HCT and switch her to olmesartan HCT 40/25 mg in the morning.  I will obtain a BMP prior to cardioversion.   In view of atrial fibrillation and difficult to control hypertension, I would consider sleep study.  With regard to hyperlipidemia, she is wanting to make changes to her lifestyle before starting any other new medical therapy.  We will discuss this again in 3 to 4 months after rechecking lipids.  This was a 40-minute office visit encounter with discussions regarding complex medical issues and also procedure. Schedule for Direct current cardioversion. I have discussed regarding risks benefits rate control vs rhythm control with the patient. Patient understands cardiac arrest and need for CPR, aspiration pneumonia, but not limited to these. Patient is willing.     Adrian Prows, MD, [facility] 04/01/2020, 5:29 PM Office: [phone] Pager: [phone]

## 2020-04-08 LAB — BASIC METABOLIC PANEL
BUN/Creatinine Ratio: 13 (ref 9–23)
BUN: 12 mg/dL (ref 6–24)
CO2: 22 mmol/L (ref 20–29)
Calcium: 9.8 mg/dL (ref 8.7–10.2)
Chloride: 88 mmol/L — ABNORMAL LOW (ref 96–106)
Creatinine, Ser: 0.94 mg/dL (ref 0.57–1.00)
GFR calc Af Amer: 77 mL/min/{1.73_m2} (ref >59)
GFR calc non Af Amer: 67 mL/min/{1.73_m2} (ref >59)
Glucose: 106 mg/dL — ABNORMAL HIGH (ref 65–99)
Potassium: 4.6 mmol/L (ref 3.5–5.2)
Sodium: 128 mmol/L — ABNORMAL LOW (ref 134–144)

## 2020-04-08 NOTE — Telephone Encounter (Signed)
From patient.

## 2020-04-09 ENCOUNTER — Other Ambulatory Visit (HOSPITAL_COMMUNITY)
Admission: RE | Admit: 2020-04-09 | Discharge: 2020-04-09 | Disposition: A | Discharge status: Home / Self Care | Payer: BC Managed Care – PPO | Source: Ambulatory Visit | Attending: Cardiology | Admitting: Cardiology

## 2020-04-09 DIAGNOSIS — Z20822 Contact with and (suspected) exposure to covid-19: Secondary | ICD-10-CM | POA: Insufficient documentation

## 2020-04-09 DIAGNOSIS — Z01812 Encounter for preprocedural laboratory examination: Secondary | ICD-10-CM | POA: Diagnosis present

## 2020-04-09 LAB — SARS CORONAVIRUS 2 (TAT 6-24 HRS): SARS Coronavirus 2: NEGATIVE

## 2020-04-13 ENCOUNTER — Encounter (HOSPITAL_COMMUNITY): Payer: Self-pay | Admitting: Cardiology

## 2020-04-13 ENCOUNTER — Encounter (HOSPITAL_COMMUNITY)
Admission: RE | Disposition: A | Discharge status: Home / Self Care | Payer: Self-pay | Source: Home / Self Care | Attending: Cardiology

## 2020-04-13 ENCOUNTER — Ambulatory Visit (HOSPITAL_COMMUNITY): Payer: BC Managed Care – PPO | Admitting: Certified Registered Nurse Anesthetist

## 2020-04-13 ENCOUNTER — Other Ambulatory Visit: Payer: Self-pay | Admitting: Cardiology

## 2020-04-13 ENCOUNTER — Ambulatory Visit (HOSPITAL_COMMUNITY)
Admission: RE | Admit: 2020-04-13 | Discharge: 2020-04-13 | Disposition: A | Discharge status: Home / Self Care | Payer: BC Managed Care – PPO | Attending: Cardiology | Admitting: Cardiology

## 2020-04-13 ENCOUNTER — Other Ambulatory Visit: Payer: Self-pay

## 2020-04-13 DIAGNOSIS — Z79899 Other long term (current) drug therapy: Secondary | ICD-10-CM | POA: Diagnosis not present

## 2020-04-13 DIAGNOSIS — Z7901 Long term (current) use of anticoagulants: Secondary | ICD-10-CM | POA: Insufficient documentation

## 2020-04-13 DIAGNOSIS — I10 Essential (primary) hypertension: Secondary | ICD-10-CM | POA: Insufficient documentation

## 2020-04-13 DIAGNOSIS — I4819 Other persistent atrial fibrillation: Secondary | ICD-10-CM | POA: Insufficient documentation

## 2020-04-13 DIAGNOSIS — Z87891 Personal history of nicotine dependence: Secondary | ICD-10-CM | POA: Diagnosis not present

## 2020-04-13 DIAGNOSIS — E785 Hyperlipidemia, unspecified: Secondary | ICD-10-CM | POA: Diagnosis not present

## 2020-04-13 HISTORY — PX: CARDIOVERSION: SHX1299

## 2020-04-13 SURGERY — CARDIOVERSION
Anesthesia: General

## 2020-04-13 MED ORDER — PROPOFOL 10 MG/ML IV BOLUS
INTRAVENOUS | Status: DC | PRN
  Administered 2020-04-13 (×2): 50 mg via INTRAVENOUS
  Administered 2020-04-13: 30 mg via INTRAVENOUS

## 2020-04-13 MED ORDER — SODIUM CHLORIDE 0.9 % IV SOLN: INTRAVENOUS | Status: DC | PRN

## 2020-04-13 MED ORDER — LIDOCAINE 2% (20 MG/ML) 5 ML SYRINGE
INTRAMUSCULAR | Status: DC | PRN
  Administered 2020-04-13: 80 mg via INTRAVENOUS

## 2020-04-13 NOTE — Anesthesia Procedure Notes (Signed)
Procedure Name: General with mask airway Date/Time: 04/13/2020 12:14 PM Performed by: Colin Benton, CRNA Pre-anesthesia Checklist: Patient identified, Emergency Drugs available, Suction available, Patient being monitored and Timeout performed Patient Re-evaluated:Patient Re-evaluated prior to induction Oxygen Delivery Method: Ambu bag Preoxygenation: Pre-oxygenation with 100% oxygen Induction Type: IV induction Placement Confirmation: positive ETCO2 Dental Injury: Teeth and Oropharynx as per pre-operative assessment

## 2020-04-13 NOTE — Anesthesia Preprocedure Evaluation (Addendum)
Anesthesia Evaluation  Patient identified by MRN, date of birth, ID band Patient awake    Reviewed: Allergy & Precautions, NPO status , Patient's Chart, lab work & pertinent test results  History of Anesthesia Complications Negative for: history of anesthetic complications  Airway Mallampati: II  TM Distance: >3 FB Neck ROM: Full    Dental no notable dental hx.    Pulmonary former smoker,    Pulmonary exam normal        Cardiovascular hypertension, Pt. on medications Normal cardiovascular exam+ dysrhythmias (on flecainide and Xarelto) Atrial Fibrillation   Echocardiogram 02/26/2020: EF 68%, severe LAE, mild MR     Neuro/Psych Anxiety Depression negative neurological ROS     GI/Hepatic Neg liver ROS, GERD  Controlled,  Endo/Other  negative endocrine ROS  Renal/GU negative Renal ROS  negative genitourinary   Musculoskeletal negative musculoskeletal ROS (+)   Abdominal   Peds  Hematology negative hematology ROS (+)   Anesthesia Other Findings Day of surgery medications reviewed with patient.  Reproductive/Obstetrics negative OB ROS                            Anesthesia Physical Anesthesia Plan  ASA: III  Anesthesia Plan: General   Post-op Pain Management:    Induction: Intravenous  PONV Risk Score and Plan: Treatment may vary due to age or medical condition and Propofol infusion  Airway Management Planned: Mask  Additional Equipment: None  Intra-op Plan:   Post-operative Plan:   Informed Consent: I have reviewed the patients History and Physical, chart, labs and discussed the procedure including the risks, benefits and alternatives for the proposed anesthesia with the patient or authorized representative who has indicated his/her understanding and acceptance.       Plan Discussed with: CRNA  Anesthesia Plan Comments:        Anesthesia Quick Evaluation

## 2020-04-13 NOTE — H&P (Signed)
OV copied for documentation    Primary Physician:  Larene Beach, MD  Patient ID: Gwendolyn Thompson, female    DOB: 1960/09/28, 60 y.o.   MRN: [id number]  Subjective:    No chief complaint on file.   HPI: Gwendolyn Thompson  is a 60 y.o. female  with hypertension, hyperlipidemia, history of basal cell carcinoma, with brief episodes of A fib and atrial tachycardia on 4 day monitor performed by PCP on 07/31/2018.  She was seen by me on 02/24/2020 and patient was back in atrial fibrillation although asymptomatic.  She is a former smoker that quit over 30 years ago. Occasional alcohol use. No illicit drug use.  She now presents for follow-up of hypertension and atrial fibrillation.  She has been on chronic anticoagulation with Xarelto.  No specific complaints today but admits to not eating healthy or exercising.  Denies chest pain or dyspnea.  Denies palpitations, dizziness or syncope.  Past Medical History:  Diagnosis Date  . Anxious depression   . Atrial fibrillation ([place]) 09/17/2018  . B12 deficiency-borderline 09/17/2013   Level was 210, July 2015  . Cancer ([place])    basal cell removed from nose  . Cervical dysplasia   . Depression   . Esophageal reflux   . HTN (hypertension)   . Hyperlipidemia   . Hyperplastic colon polyp   . Palpitations     Past Surgical History:  Procedure Laterality Date  . COLONOSCOPY W/ BIOPSIES  08-26-2012   Dr. Glennon Hamilton   . COLPOSCOPY    . GYNECOLOGIC CRYOSURGERY  1987  . MOUTH SURGERY     Social History   Tobacco Use  . Smoking status: Former Smoker    Packs/day: 0.25    Years: 3.00    Pack years: 0.75    Types: Cigarettes    Quit date: 08/20/1988    Years since quitting: 31.6  . Smokeless tobacco: Never Used  Substance Use Topics  . Alcohol use: Yes    Comment: 4-6 DRINKS A WEEK- WINE   Marital Status: Married   Review of Systems  Cardiovascular: Negative for chest pain, dyspnea on exertion and leg swelling.  Gastrointestinal: Negative  for melena.   Objective:  Blood pressure 97/70, pulse 72, temperature 97.7 F (36.5 C), temperature source Axillary, resp. rate 14, height '5\' 1"'  (1.549 m), weight 61.2 kg, SpO2 100 %. Body mass index is 25.51 kg/m.   Vitals with BMI 04/13/2020 04/01/2020 04/01/2020  Height '5\' 1"'  - 5' 0.5"  Weight 135 lbs - 138 lbs 6 oz  BMI 37.48 - 27.07  Systolic 97 867 544  Diastolic 70 92 99  Pulse 72 - 86       Physical Exam Vitals reviewed.  Constitutional:      Appearance: She is well-developed.  Cardiovascular:     Rate and Rhythm: Normal rate. Rhythm irregular. Frequent extrasystoles are present.    Pulses: Normal pulses and intact distal pulses.     Heart sounds: S1 normal and S2 normal. Murmur heard.   Blowing midsystolic murmur is present with a grade of 2/6 at the apex.   Pulmonary:     Effort: Pulmonary effort is normal. No accessory muscle usage or respiratory distress.     Breath sounds: Normal breath sounds.  Abdominal:     General: Bowel sounds are normal.     Palpations: Abdomen is soft.    Radiology: No results found.  Laboratory examination:   CMP Latest Ref Rng & Units 04/07/2020 09/18/2019 06/26/2019  Glucose 65 - 99 mg/dL 106(H) 94 -  BUN 6 - 24 mg/dL 12 9 -  Creatinine 0.57 - 1.00 mg/dL 0.94 0.61 -  Sodium 134 - 144 mmol/L 128(L) 131(L) -  Potassium 3.5 - 5.2 mmol/L 4.6 4.5 4.9  Chloride 96 - 106 mmol/L 88(L) 88(L) -  CO2 20 - 29 mmol/L 22 25 -  Calcium 8.7 - 10.2 mg/dL 9.8 9.4 -  Total Protein 6.0 - 8.5 g/dL - - -  Total Bilirubin 0.0 - 1.2 mg/dL - - -  Alkaline Phos 39 - 117 IU/L - - -  AST 0 - 40 IU/L - - -  ALT 0 - 32 IU/L - - -   CBC Latest Ref Rng & Units 04/14/2010  WBC 4.0 - 10.5 K/uL 6.6  Hemoglobin 12.0 - 15.0 g/dL 13.2  Hematocrit 36.0 - 46.0 % 37.1  Platelets 150 - 400 K/uL 268   Lipid Panel Recent Labs    09/18/19 0837 03/31/20 0901  CHOL 214* 207*  TRIG 207* 191*  LDLCALC 113* 106*  HDL 65 68    External labs:   Lab  06/06/2019: Serum glucose 90 mg, BUN 11, creatinine 0.72, EGFR 93 mL, sodium 137, potassium 5.4.  07/24/2018: Potassium 3.2, creatinine 0.6, EGFR greater than 90, AST 51, ALT normal at 45 (previously AST 74 and ALT 66 in January 2020).  TSH 1.2.  Normal H&H, MCH 33.1, CBC otherwise normal.  02/21/2018: Cholesterol 287, triglycerides 239, HDL 79, LDL 183.  No current facility-administered medications on file prior to encounter.   Current Outpatient Medications on File Prior to Encounter  Medication Sig Dispense Refill  . ALPRAZolam (XANAX) 0.5 MG tablet Take 0.25 mg by mouth daily as needed for anxiety.    . AMBULATORY NON FORMULARY MEDICATION Take 2 tablets by mouth daily. AHCC Immune Booster  at breakfast  500 mg each    . AMBULATORY NON FORMULARY MEDICATION Take 4 tablets by mouth daily. ALJ Herbal Respiratory  at breakfast    . atorvastatin (LIPITOR) 80 MG tablet TAKE 1 TABLET BY MOUTH EVERY DAY (Patient taking differently: Take 80 mg by mouth at bedtime.) 90 tablet 3  . Calcium Carbonate-Vitamin D3 600-400 MG-UNIT TABS Take 1 tablet by mouth daily.    . cetirizine (ZYRTEC) 10 MG tablet Take 10 mg by mouth daily. [employer]    . Cholecalciferol (VITAMIN D3) 50 MCG (2000 UT) TABS Take 2,000 Units by mouth daily.    . Coenzyme Q10 (CO Q 10 PO) Take 300 mg by mouth daily.    Marland Kitchen ezetimibe (ZETIA) 10 MG tablet TAKE 1 TABLET BY MOUTH EVERY DAY (Patient taking differently: Take 10 mg by mouth at bedtime.) 90 tablet 1  . flecainide (TAMBOCOR) 100 MG tablet Take 1 tablet (100 mg total) by mouth 2 (two) times daily. 60 tablet 1  . fluticasone (FLONASE) 50 MCG/ACT nasal spray Place 1 spray into both nostrils daily.    . metoprolol succinate (TOPROL-XL) 100 MG 24 hr tablet Take 1 tablet (100 mg total) by mouth daily. Take with or immediately following a meal. (Patient taking differently: Take 100 mg by mouth at bedtime. Take with or immediately following a meal.) 90 tablet 3  .  olmesartan-hydrochlorothiazide (BENICAR HCT) 40-25 MG tablet Take 1 tablet by mouth every morning. 90 tablet 3  . OVER THE COUNTER MEDICATION Take 2 capsules by mouth daily. Liver detox    . Propylene Glycol (SYSTANE COMPLETE OP) Place 1 drop into both eyes daily as needed (  Dry eyes).    . rivaroxaban (XARELTO) 20 MG TABS tablet Take 1 tablet (20 mg total) by mouth daily. (Patient taking differently: Take 20 mg by mouth daily with supper.) 30 tablet 12  . TURMERIC CURCUMIN PO Take 1,000 mg by mouth daily. 500 mg each    . venlafaxine (EFFEXOR) 75 MG tablet Take 75 mg by mouth daily.        Cardiac Studies:   Lexiscan Sestamibi Stress Test 09/11/2018: Resting EKG normal sinus rhythm, LVH, occasional PVCs.  Stress EKG nondiagnostic due to pharmacologic stress testing.  Hypertensive throughout the study with resting blood pressure 160/110 mmHg. Myocardial perfusion imaging is normal. Left ventricular ejection fraction is  74% with normal wall motion. Low risk study.  4 day Ziopatch 07/31/2018: Dominant rhythm, Normal sinus rhythm. Episodes of brief atrial fibrillation with longest episode being on 06/17 at 1255 for 2 m 56s 112-182 bpm. Some episodes reported as A fib are questionable for Atrial tachycardia (06/19 and 06/18). A fib burden <1%. Patient triggered events for fluttering/racing correlated with A fib. 31 reported episodes of atrial tachycardia with fastest HR 204 bpm on 06/17 at 1437. Avg HR 147 bpm. Longest episode for 8 m on 06/16 at 1505. Rare PAC and PVC with 1 episode ventricular bigeminy for 3.4 s on 06/19.   Echocardiogram 02/26/2020: 1. Normal LV systolic function with EF 68%. Left ventricle cavity is normal in size. Normal global wall motion. Unable to evaluate diastolic function due to atrial fibrillation. Calculated EF 68%. 2. Left atrial cavity is severely dilated at 4.8 cm. 3. Structurally normal mitral valve. Mild (Grade I) mitral regurgitation. 4. Compared to 08/21/2011,  atrial fibrillation is new.   Renal duplex 08/20/11: No renal artery stenosis. Normal kidney size bilateral.  EKG:    EKG 04/01/2020: Atrial fibrillation with controlled ventricular response at the rate of 92 beats minute, normal axis.  LVH with repolarization abnormality, cannot exclude lateral ischemia.    EKG 04/22/2019: Sinus rhythm vs. Ectopic atrial rhythm at 72 bpm, normal axis, LVH. No evidence of ischemia.   Assessment:   No diagnosis found. No orders of the defined types were placed in this encounter.   Medications Discontinued During This Encounter  Medication Reason  . Cholecalciferol (VITAMIN D-3 PO) Patient Preference  . Calcium Carbonate Antacid 600 MG chewable tablet Patient Preference    Recommendations:   Gwendolyn Thompson  is a 60 y.o. with hypertension, hyperlipidemia, history of basal cell carcinoma, with brief episodes of A fib and atrial tachycardia on 4 day monitor performed by PCP on 07/31/2018.  She was seen by me on 02/24/2020 and patient was back in atrial fibrillation although asymptomatic.  She is a former smoker that quit over 30 years ago. Occasional alcohol use. No illicit drug use.  She is essentially asymptomatic with regard to atrial fibrillation however due to long term effects on diastolic function, especially in view of hypertension and moderate LVH, would prefer to maintain sinus rhythm.  I will start the patient on flecainide 100 mg p.o. twice daily.  As she has been on anticoagulation, I will schedule her for direct-current cardioversion.     Blood pressure is not well controlled in spite of aggressive medical therapy.  I will obtain renal artery duplex although in 2013 she had normal renal artery duplex.  I would like to exclude FMD or atherosclerotic renal artery stenosis.  I will discontinue lisinopril HCT and switch her to olmesartan HCT 40/25 mg in the morning.  I will obtain a BMP prior to cardioversion.  In view of atrial fibrillation and  difficult to control hypertension, I would consider sleep study.  With regard to hyperlipidemia, she is wanting to make changes to her lifestyle before starting any other new medical therapy.  We will discuss this again in 3 to 4 months after rechecking lipids.  This was a 40-minute office visit encounter with discussions regarding complex medical issues and also procedure. Schedule for Direct current cardioversion. I have discussed regarding risks benefits rate control vs rhythm control with the patient. Patient understands cardiac arrest and need for CPR, aspiration pneumonia, but not limited to these. Patient is willing.     Nigel Mormon, MD, [facility] 04/13/2020, 12:07 PM Office: [phone] Pager: [phone]

## 2020-04-13 NOTE — Anesthesia Postprocedure Evaluation (Signed)
Anesthesia Post Note  Patient: Sharnelle Cappelli  Procedure(s) Performed: CARDIOVERSION (N/A )     Patient location during evaluation: PACU Anesthesia Type: General Level of consciousness: awake and alert and oriented Pain management: pain level controlled Vital Signs Assessment: post-procedure vital signs reviewed and stable Respiratory status: spontaneous breathing, nonlabored ventilation and respiratory function stable Cardiovascular status: blood pressure returned to baseline Postop Assessment: no apparent nausea or vomiting Anesthetic complications: no   No complications documented.  Last Vitals:  Vitals:   04/13/20 1220 04/13/20 1226  BP: 103/61 97/73  Pulse: 71 65  Resp: (!) 22 19  Temp:  36.5 C  SpO2: 100% 93%    Last Pain:  Vitals:   04/13/20 1226  TempSrc: Oral  PainSc: 0-No pain                 Brennan Bailey

## 2020-04-13 NOTE — Addendum Note (Signed)
Addendum  created 04/13/20 1302 by Brennan Bailey, MD   Attestation recorded in [place], [place] filed

## 2020-04-13 NOTE — CV Procedure (Signed)
Direct current cardioversion:  Indication symptomatic: Symptomatic atrial fibrillation  Procedure: Under deep sedation administered and monitored by anesthesiology, synchronized direct current cardioversion performed. Patient was delivered with 120, 150, 200 Joules of electricity X 3 without success to NSR. Patient tolerated the procedure well. No immediate complication noted.   Nigel Mormon, MD [facility] Pager: [phone] Office: [phone] If no answer Cell [phone]

## 2020-04-13 NOTE — Addendum Note (Signed)
Addendum  created 04/13/20 1302 by Merlinda Frederick, MD   Attestation recorded in [place], [place] deleted, [place] filed

## 2020-04-13 NOTE — Transfer of Care (Signed)
Immediate Anesthesia Transfer of Care Note  Patient: Gwendolyn Thompson  Procedure(s) Performed: CARDIOVERSION (N/A )  Patient Location: Endoscopy Unit  Anesthesia Type:General  Level of Consciousness: drowsy  Airway & Oxygen Therapy: Patient Spontanous Breathing  Post-op Assessment: Report given to RN and Post -op Vital signs reviewed and stable  Post vital signs: Reviewed and stable  Last Vitals:  Vitals Value Taken Time  BP 103/61 1221  Temp    Pulse 71 1221  Resp 14 1221  SpO2 95 1221    Last Pain:  Vitals:   04/13/20 1118  TempSrc: Axillary  PainSc: 0-No pain         Complications: No complications documented.

## 2020-04-13 NOTE — Discharge Instructions (Signed)
Electrical Cardioversion Electrical cardioversion is the delivery of a jolt of electricity to restore a normal rhythm to the heart. A rhythm that is too fast or is not regular keeps the heart from pumping well. In this procedure, sticky patches or metal paddles are placed on the chest to deliver electricity to the heart from a device. This procedure may be done in an emergency if:  There is low or no blood pressure as a result of the heart rhythm.  Normal rhythm must be restored as fast as possible to protect the brain and heart from further damage.  It may save a life. This may also be a scheduled procedure for irregular or fast heart rhythms that are not immediately life-threatening. Tell a health care provider about:  Any allergies you have.  All medicines you are taking, including vitamins, herbs, eye drops, creams, and over-the-counter medicines.  Any problems you or family members have had with anesthetic medicines.  Any blood disorders you have.  Any surgeries you have had.  Any medical conditions you have.  Whether you are pregnant or may be pregnant. What are the risks? Generally, this is a safe procedure. However, problems may occur, including:  Allergic reactions to medicines.  A blood clot that breaks free and travels to other parts of your body.  The possible return of an abnormal heart rhythm within hours or days after the procedure.  Your heart stopping (cardiac arrest). This is rare. What happens before the procedure? Medicines  Your health care provider may have you start taking: ? Blood-thinning medicines (anticoagulants) so your blood does not clot as easily. ? Medicines to help stabilize your heart rate and rhythm.  Ask your health care provider about: ? Changing or stopping your regular medicines. This is especially important if you are taking diabetes medicines or blood thinners. ? Taking medicines such as aspirin and ibuprofen. These medicines can  thin your blood. Do not take these medicines unless your health care provider tells you to take them. ? Taking over-the-counter medicines, vitamins, herbs, and supplements. General instructions  Follow instructions from your health care provider about eating or drinking restrictions.  Plan to have someone take you home from the hospital or clinic.  If you will be going home right after the procedure, plan to have someone with you for 24 hours.  Ask your health care provider what steps will be taken to help prevent infection. These may include washing your skin with a germ-killing soap. What happens during the procedure?  An IV will be inserted into one of your veins.  Sticky patches (electrodes) or metal paddles may be placed on your chest.  You will be given a medicine to help you relax (sedative).  An electrical shock will be delivered. The procedure may vary among health care providers and hospitals.   What can I expect after the procedure?  Your blood pressure, heart rate, breathing rate, and blood oxygen level will be monitored until you leave the hospital or clinic.  Your heart rhythm will be watched to make sure it does not change.  You may have some redness on the skin where the shocks were given. Follow these instructions at home:  Do not drive for 24 hours if you were given a sedative during your procedure.  Take over-the-counter and prescription medicines only as told by your health care provider.  Ask your health care provider how to check your pulse. Check it often.  Rest for 48 hours after the procedure   or as told by your health care provider.  Avoid or limit your caffeine use as told by your health care provider.  Keep all follow-up visits as told by your health care provider. This is important. Contact a health care provider if:  You feel like your heart is beating too quickly or your pulse is not regular.  You have a serious muscle cramp that does not go  away. Get help right away if:  You have discomfort in your chest.  You are dizzy or you feel faint.  You have trouble breathing or you are short of breath.  Your speech is slurred.  You have trouble moving an arm or leg on one side of your body.  Your fingers or toes turn cold or blue. Summary  Electrical cardioversion is the delivery of a jolt of electricity to restore a normal rhythm to the heart.  This procedure may be done right away in an emergency or may be a scheduled procedure if the condition is not an emergency.  Generally, this is a safe procedure.  After the procedure, check your pulse often as told by your health care provider. This information is not intended to replace advice given to you by your health care provider. Make sure you discuss any questions you have with your health care provider. Document Revised: 09/02/2018 Document Reviewed: 09/02/2018 Elsevier Patient Education  2021 Elsevier Inc.  

## 2020-04-13 NOTE — Interval H&P Note (Signed)
History and Physical Interval Note:  04/13/2020 12:08 PM  Gwendolyn Thompson  has presented today for surgery, with the diagnosis of AFIB.  The various methods of treatment have been discussed with the patient and family. After consideration of risks, benefits and other options for treatment, the patient has consented to  Procedure(s): CARDIOVERSION (N/A) as a surgical intervention.  The patient's history has been reviewed, patient examined, no change in status, stable for surgery.  I have reviewed the patient's chart and labs.  Questions were answered to the patient's satisfaction.     [place]

## 2020-04-14 ENCOUNTER — Encounter (HOSPITAL_COMMUNITY): Payer: Self-pay | Admitting: Cardiology

## 2020-04-15 ENCOUNTER — Other Ambulatory Visit: Payer: Self-pay | Admitting: Cardiology

## 2020-04-15 DIAGNOSIS — I10 Essential (primary) hypertension: Secondary | ICD-10-CM

## 2020-04-15 DIAGNOSIS — I480 Paroxysmal atrial fibrillation: Secondary | ICD-10-CM

## 2020-04-15 MED ORDER — OLMESARTAN MEDOXOMIL 40 MG PO TABS: 40.0000 mg | ORAL_TABLET | Freq: Every day | ORAL | 1 refills | Status: DC

## 2020-04-15 MED ORDER — MULTAQ 400 MG PO TABS
400.0000 mg | ORAL_TABLET | Freq: Two times a day (BID) | ORAL | 3 refills | Status: DC
Start: 2020-04-15 — End: 2020-05-19

## 2020-04-15 NOTE — Telephone Encounter (Signed)
From pt

## 2020-04-15 NOTE — Telephone Encounter (Signed)
Patient's direct-current cardioversion on 04/13/2020 was unsuccessful.  Patient presently on flecainide.  We will discontinue flecainide and should be advised to start Multaq 4 mg p.o. twice daily 5 days after discontinuing flecainide.  Patient also had hyponatremia hence will discontinue Benicar HCT and switch her back to Benicar plain 40 mg daily.  Will check BMP in 2 weeks.    ICD-10-CM   1. Paroxysmal atrial fibrillation (HCC)  I48.0 dronedarone (MULTAQ) 400 MG tablet    Basic metabolic panel  2. Essential hypertension  I10 olmesartan (BENICAR) 40 MG tablet   Meds ordered this encounter  Medications  . olmesartan (BENICAR) 40 MG tablet    Sig: Take 1 tablet (40 mg total) by mouth daily.    Dispense:  90 tablet    Refill:  1    Discontinue olmesartan HCT due to hyponatremia.  Marland Kitchen dronedarone (MULTAQ) 400 MG tablet    Sig: Take 1 tablet (400 mg total) by mouth 2 (two) times daily with a meal. Start 5 days after stopping flecainide    Dispense:  60 tablet    Refill:  3   Medications Discontinued During This Encounter  Medication Reason  . flecainide (TAMBOCOR) 100 MG tablet Ineffective  . olmesartan-hydrochlorothiazide (BENICAR HCT) 40-25 MG tablet Side effect (s)     Adrian Prows, MD, [facility] 04/15/2020, 5:47 PM Office: [phone] Pager: [phone]

## 2020-04-20 NOTE — Telephone Encounter (Signed)
Please give her samples of Multaq 400 mg PO BID.

## 2020-04-20 NOTE — Telephone Encounter (Signed)
We have a starter pack of Multaq, can she have that?

## 2020-04-21 ENCOUNTER — Ambulatory Visit: Payer: BC Managed Care – PPO

## 2020-04-21 ENCOUNTER — Other Ambulatory Visit: Payer: Self-pay

## 2020-04-21 DIAGNOSIS — I10 Essential (primary) hypertension: Secondary | ICD-10-CM

## 2020-04-21 NOTE — Telephone Encounter (Signed)
Done

## 2020-04-22 NOTE — Telephone Encounter (Signed)
From patient.

## 2020-04-25 ENCOUNTER — Other Ambulatory Visit: Payer: Self-pay | Admitting: Cardiology

## 2020-04-25 DIAGNOSIS — I480 Paroxysmal atrial fibrillation: Secondary | ICD-10-CM

## 2020-04-29 ENCOUNTER — Encounter: Payer: Self-pay | Admitting: Cardiology

## 2020-04-29 ENCOUNTER — Other Ambulatory Visit: Payer: Self-pay

## 2020-04-29 ENCOUNTER — Ambulatory Visit: Payer: BC Managed Care – PPO | Admitting: Cardiology

## 2020-04-29 VITALS — BP 146/87 | HR 85 | Temp 97.7°F | Resp 17 | Ht 61.0 in | Wt 140.0 lb

## 2020-04-29 DIAGNOSIS — I10 Essential (primary) hypertension: Secondary | ICD-10-CM

## 2020-04-29 DIAGNOSIS — E7800 Pure hypercholesterolemia, unspecified: Secondary | ICD-10-CM

## 2020-04-29 DIAGNOSIS — I480 Paroxysmal atrial fibrillation: Secondary | ICD-10-CM

## 2020-04-29 DIAGNOSIS — E871 Hypo-osmolality and hyponatremia: Secondary | ICD-10-CM

## 2020-04-29 NOTE — Progress Notes (Signed)
Primary Physician:  Nelly Laurence, NP  Patient ID: [name], female    DOB: [birth year], 60 y.o.   MRN: [id number]  Subjective:    Chief Complaint  Patient presents with  . Atrial Fibrillation  . Follow-up    4 WEEKS    HPI: Gwendolyn Thompson  is a 60 y.o. female  with hypertension, hyperlipidemia, history of basal cell carcinoma, with brief episodes of A fib and atrial tachycardia on 4 day monitor performed by PCP on 07/31/2018.  She was seen by me on 02/24/2020 and patient was back in atrial fibrillation although asymptomatic. She underwent unsuccessful attempt on 04/13/2020 for atrial fibrillation cardioversion.  She was started on Multitaq (5 days after Flecainide discontinued.  She is a former smoker that quit over 30 years ago. Occasional alcohol use. No illicit drug use.  She now presents for follow-up of hypertension and atrial fibrillation.  She has been on chronic anticoagulation with Xarelto.  No specific complaints today but says recent visit, has made significant lifestyle changes with regard to her eating habits.  Denies chest pain or dyspnea.  Denies palpitations, dizziness or syncope.  Past Medical History:  Diagnosis Date  . Anxious depression   . Atrial fibrillation ([place]) 09/17/2018  . B12 deficiency-borderline 09/17/2013   Level was 210, July 2015  . Cancer ([place])    basal cell removed from nose  . Cervical dysplasia   . Depression   . Esophageal reflux   . HTN (hypertension)   . Hyperlipidemia   . Hyperplastic colon polyp   . Palpitations     Past Surgical History:  Procedure Laterality Date  . CARDIOVERSION N/A 04/13/2020   Procedure: CARDIOVERSION;  Surgeon: Nigel Mormon, MD;  Location: [place];  Service: Cardiovascular;  Laterality: N/A;  . COLONOSCOPY W/ BIOPSIES  08-26-2012   Dr. Glennon Hamilton   . COLPOSCOPY    . GYNECOLOGIC CRYOSURGERY  1987  . MOUTH SURGERY     Social History   Tobacco Use  . Smoking status: Former Smoker    Packs/day:  0.25    Years: 3.00    Pack years: 0.75    Types: Cigarettes    Quit date: 08/20/1988    Years since quitting: 31.7  . Smokeless tobacco: Never Used  Substance Use Topics  . Alcohol use: Yes    Comment: 4-6 DRINKS A WEEK- WINE   Marital Status: Married   Review of Systems  Cardiovascular: Negative for chest pain, dyspnea on exertion and leg swelling.  Gastrointestinal: Negative for melena.   Objective:  Blood pressure (!) 146/87, pulse 85, temperature 97.7 F (36.5 C), temperature source Temporal, resp. rate 17, height '5\' 1"'  (1.549 m), weight 140 lb (63.5 kg), SpO2 98 %. Body mass index is 26.45 kg/m.   Vitals with BMI 04/29/2020 04/13/2020 04/13/2020  Height '5\' 1"'  - -  Weight 140 lbs - -  BMI 27.03 - -  Systolic 500 938 182  Diastolic 87 71 71  Pulse 85 59 68       Physical Exam Vitals reviewed.  Constitutional:      Appearance: She is well-developed.  Cardiovascular:     Rate and Rhythm: Normal rate. Rhythm irregular. Frequent extrasystoles are present.    Pulses: Normal pulses and intact distal pulses.     Heart sounds: S1 normal and S2 normal. Murmur heard.   Midsystolic murmur is present with a grade of 2/6 at the apex.   Pulmonary:     Effort: Pulmonary  effort is normal. No accessory muscle usage or respiratory distress.     Breath sounds: Normal breath sounds.  Abdominal:     General: Bowel sounds are normal.     Palpations: Abdomen is soft.    Radiology: No results found.  Laboratory examination:   CMP Latest Ref Rng & Units 04/07/2020 09/18/2019 06/26/2019  Glucose 65 - 99 mg/dL 106(H) 94 -  BUN 6 - 24 mg/dL 12 9 -  Creatinine 0.57 - 1.00 mg/dL 0.94 0.61 -  Sodium 134 - 144 mmol/L 128(L) 131(L) -  Potassium 3.5 - 5.2 mmol/L 4.6 4.5 4.9  Chloride 96 - 106 mmol/L 88(L) 88(L) -  CO2 20 - 29 mmol/L 22 25 -  Calcium 8.7 - 10.2 mg/dL 9.8 9.4 -  Total Protein 6.0 - 8.5 g/dL - - -  Total Bilirubin 0.0 - 1.2 mg/dL - - -  Alkaline Phos 39 - 117 IU/L - - -  AST 0  - 40 IU/L - - -  ALT 0 - 32 IU/L - - -   CBC Latest Ref Rng & Units 04/14/2010  WBC 4.0 - 10.5 K/uL 6.6  Hemoglobin 12.0 - 15.0 g/dL 13.2  Hematocrit 36.0 - 46.0 % 37.1  Platelets 150 - 400 K/uL 268   Lipid Panel Recent Labs    09/18/19 0837 03/31/20 0901  CHOL 214* 207*  TRIG 207* 191*  LDLCALC 113* 106*  HDL 65 68    External labs:  Labs 04/21/2020:  Total cholesterol 173, triglycerides 143, HDL 65, LDL 84.  Lab 06/06/2019: Serum glucose 90 mg, BUN 11, creatinine 0.72, EGFR 93 mL, sodium 137, potassium 5.4.  07/24/2018: Potassium 3.2, creatinine 0.6, EGFR greater than 90, AST 51, ALT normal at 45 (previously AST 74 and ALT 66 in January 2020).  TSH 1.2.  Normal H&H, MCH 33.1, CBC otherwise normal.  02/21/2018: Cholesterol 287, triglycerides 239, HDL 79, LDL 183.  Current Outpatient Medications on File Prior to Visit  Medication Sig Dispense Refill  . ALPRAZolam (XANAX) 0.5 MG tablet Take 0.25 mg by mouth daily as needed for anxiety.    . AMBULATORY NON FORMULARY MEDICATION Take 2 tablets by mouth daily. AHCC Immune Booster  at breakfast  500 mg each    . AMBULATORY NON FORMULARY MEDICATION Take 4 tablets by mouth daily. ALJ Herbal Respiratory  at breakfast    . atorvastatin (LIPITOR) 80 MG tablet TAKE 1 TABLET BY MOUTH EVERY DAY (Patient taking differently: Take 80 mg by mouth at bedtime.) 90 tablet 3  . augmented betamethasone dipropionate (DIPROLENE-AF) 0.05 % ointment Apply 1 application topically as needed.    . Calcium Carbonate-Vitamin D3 600-400 MG-UNIT TABS Take 1 tablet by mouth daily.    . cetirizine (ZYRTEC) 10 MG tablet Take 10 mg by mouth daily. [employer]    . Cholecalciferol (VITAMIN D3) 50 MCG (2000 UT) TABS Take 2,000 Units by mouth daily.    . Coenzyme Q10 (CO Q 10 PO) Take 300 mg by mouth daily.    Marland Kitchen diltiazem (CARDIZEM CD) 360 MG 24 hr capsule TAKE 1 CAPSULE BY MOUTH EVERY DAY 90 capsule 1  . dronedarone (MULTAQ) 400 MG tablet Take 1 tablet (400 mg  total) by mouth 2 (two) times daily with a meal. Start 5 days after stopping flecainide 60 tablet 3  . ezetimibe (ZETIA) 10 MG tablet TAKE 1 TABLET BY MOUTH EVERY DAY (Patient taking differently: Take 10 mg by mouth at bedtime.) 90 tablet 1  . fexofenadine (ALLEGRA) 180 MG  tablet Take 180 mg by mouth daily.    . fluticasone (FLONASE) 50 MCG/ACT nasal spray Place 1 spray into both nostrils daily.    . metoprolol succinate (TOPROL-XL) 100 MG 24 hr tablet Take 1 tablet (100 mg total) by mouth daily. Take with or immediately following a meal. (Patient taking differently: Take 100 mg by mouth at bedtime. Take with or immediately following a meal.) 90 tablet 3  . olmesartan (BENICAR) 40 MG tablet Take 1 tablet (40 mg total) by mouth daily. 90 tablet 1  . OVER THE COUNTER MEDICATION Take 2 capsules by mouth daily. Liver detox    . Propylene Glycol (SYSTANE COMPLETE OP) Place 1 drop into both eyes daily as needed (Dry eyes).    . rivaroxaban (XARELTO) 20 MG TABS tablet Take 1 tablet (20 mg total) by mouth daily. (Patient taking differently: Take 20 mg by mouth daily with supper.) 30 tablet 12  . TURMERIC CURCUMIN PO Take 1,000 mg by mouth daily. 500 mg each    . venlafaxine (EFFEXOR) 75 MG tablet Take 75 mg by mouth daily.      No current facility-administered medications on file prior to visit.    Cardiac Studies:   Lexiscan Sestamibi Stress Test 09/11/2018: Resting EKG normal sinus rhythm, LVH, occasional PVCs.  Stress EKG nondiagnostic due to pharmacologic stress testing.  Hypertensive throughout the study with resting blood pressure 160/110 mmHg. Myocardial perfusion imaging is normal. Left ventricular ejection fraction is  74% with normal wall motion. Low risk study.  4 day Ziopatch 07/31/2018: Dominant rhythm, Normal sinus rhythm. Episodes of brief atrial fibrillation with longest episode being on 06/17 at 1255 for 2 m 56s 112-182 bpm. Some episodes reported as A fib are questionable for Atrial  tachycardia (06/19 and 06/18). A fib burden <1%. Patient triggered events for fluttering/racing correlated with A fib. 31 reported episodes of atrial tachycardia with fastest HR 204 bpm on 06/17 at 1437. Avg HR 147 bpm. Longest episode for 8 m on 06/16 at 1505. Rare PAC and PVC with 1 episode ventricular bigeminy for 3.4 s on 06/19.   Echocardiogram 02/26/2020: 1. Normal LV systolic function with EF 68%. Left ventricle cavity is normal in size. Normal global wall motion. Unable to evaluate diastolic function due to atrial fibrillation. Calculated EF 68%. 2. Left atrial cavity is severely dilated at 4.8 cm. 3. Structurally normal mitral valve. Mild (Grade I) mitral regurgitation. 4. Compared to 08/21/2011, atrial fibrillation is new.   Renal duplex 08/20/11: No renal artery stenosis. Normal kidney size bilateral.  Renal artery duplex  04/21/2020:  No evidence of renal artery occlusive disease in either renal artery.   Normal intrarenal vascular perfusion is noted in the right kidney.   Renal length is within normal limits for both kidneys.  No plaque observed in the abdominal aorta. Normal abdominal aorta flow velocities noted.       EKG:    EKG 04/29/2020: Atrial fibrillation with controlled ventricular response at rate of 87 bpm, LVH with nonspecific ST-T abnormality, cannot exclude lateral ischemia. No significant change from EKG 04/01/2020: Atrial fibrillation with controlled ventricular response at the rate of 92 beats minute, normal axis.  LVH with repolarization abnormality, cannot exclude lateral ischemia.    EKG 04/22/2019: Sinus rhythm vs. Ectopic atrial rhythm at 72 bpm, normal axis, LVH. No evidence of ischemia.   Assessment:     ICD-10-CM   1. Paroxysmal atrial fibrillation (HCC)  I48.0 EKG 12-Lead  2. Essential hypertension  I10 Ambulatory referral  to Nephrology  3. Hypercholesteremia  E78.00   4. Hyponatremia  E87.1 Ambulatory referral to Nephrology   No orders of the  defined types were placed in this encounter.   There are no discontinued medications.  Recommendations:   Willis Kuipers  is a 60 y.o. with hypertension, hyperlipidemia, history of basal cell carcinoma, with brief episodes of A fib and atrial tachycardia on 4 day monitor performed by PCP on 07/31/2018.  She was seen by me on 02/24/2020 and patient was back in atrial fibrillation although asymptomatic. She underwent unsuccessful attempt on 04/13/2020 for atrial fibrillation cardioversion.  She was started on Multitaq (5 days after Flecainide discontinued.  She has made significant lifestyle changes with regard to her diet since last office visit.  Her external labs reviewed, lipids now well controlled.  Blood pressure still continues to be elevated.  No evidence of renal artery stenosis.  She continues to have hyponatremia and this has been ongoing for several years.  Would like to have nephrology evaluate.  May be related to use of ARB.  Previously was on lisinopril HCT and I had switched her to olmesartan HCT in view of uncontrolled hypertension but has since discontinued HCT but hyponatremia persists.  Although asymptomatic with regard to atrial fibrillation in view of hypertension and to preserve diastolic function we will attempt 1 more direct-current cardioversion and if she fails, could consider rate control strategy only or potentially consider EP evaluation. She is a former smoker that quit over 30 years ago. Occasional alcohol use. No illicit drug use.     Adrian Prows, MD, [facility] 04/30/2020, 5:20 AM Office: [phone] Pager: [phone]

## 2020-05-19 ENCOUNTER — Other Ambulatory Visit: Payer: Self-pay | Admitting: Student

## 2020-05-19 DIAGNOSIS — I480 Paroxysmal atrial fibrillation: Secondary | ICD-10-CM

## 2020-05-19 MED ORDER — MULTAQ 400 MG PO TABS: 400.0000 mg | ORAL_TABLET | Freq: Two times a day (BID) | ORAL | 1 refills | Status: DC

## 2020-05-19 NOTE — Telephone Encounter (Signed)
From pt

## 2020-06-10 ENCOUNTER — Encounter: Payer: Self-pay | Admitting: Cardiology

## 2020-06-10 ENCOUNTER — Ambulatory Visit: Payer: BC Managed Care – PPO | Admitting: Cardiology

## 2020-06-10 ENCOUNTER — Other Ambulatory Visit: Payer: Self-pay

## 2020-06-10 VITALS — BP 140/83 | HR 70 | Temp 98.3°F | Resp 16 | Ht 61.0 in | Wt 138.0 lb

## 2020-06-10 DIAGNOSIS — I480 Paroxysmal atrial fibrillation: Secondary | ICD-10-CM

## 2020-06-10 DIAGNOSIS — E7800 Pure hypercholesterolemia, unspecified: Secondary | ICD-10-CM

## 2020-06-10 DIAGNOSIS — E871 Hypo-osmolality and hyponatremia: Secondary | ICD-10-CM

## 2020-06-10 DIAGNOSIS — I10 Essential (primary) hypertension: Secondary | ICD-10-CM

## 2020-06-10 MED ORDER — MULTAQ 400 MG PO TABS: 400.0000 mg | ORAL_TABLET | Freq: Two times a day (BID) | ORAL | 3 refills | Status: DC

## 2020-06-10 NOTE — Progress Notes (Signed)
Primary Physician:  Nelly Laurence, NP  Patient ID: Gwendolyn Thompson, female    DOB: 07/11/1960, 60 y.o.   MRN: [id number]  Subjective:    Chief Complaint  Patient presents with  . Follow-up    6 weeks  . Atrial Fibrillation  . Hypertension    HPI: Gwendolyn Thompson  is a 60 y.o. female  with hypertension, hyperlipidemia, history of basal cell carcinoma, with brief episodes of A fib and atrial tachycardia on 4 day monitor performed by PCP on 07/31/2018.  She was seen by me on 02/24/2020 and patient was back in atrial fibrillation although asymptomatic. She underwent unsuccessful Direct-current cardioversion on 04/13/2020 for atrial fibrillation with 3 shocks. She was started on Multitaq (5 days after Flecainide discontinued.   She has been on chronic anticoagulation with Xarelto.    Since last office visit 6 weeks ago, and since being Multaq, patient has felt well and thinks that she is back in sinus rhythm.  States that she feels more energetic, she has been exercising more and has a overall wellbeing feeling. Denies chest pain or dyspnea.  Denies palpitations, dizziness or syncope.  Past Medical History:  Diagnosis Date  . Anxious depression   . Atrial fibrillation ([place]) 09/17/2018  . B12 deficiency-borderline 09/17/2013   Level was 210, July 2015  . Cancer ([place])    basal cell removed from nose  . Cervical dysplasia   . Depression   . Esophageal reflux   . HTN (hypertension)   . Hyperlipidemia   . Hyperplastic colon polyp   . Palpitations     Past Surgical History:  Procedure Laterality Date  . CARDIOVERSION N/A 04/13/2020   Procedure: CARDIOVERSION;  Surgeon: Nigel Mormon, MD;  Location: [facility];  Service: Cardiovascular;  Laterality: N/A;  . COLONOSCOPY W/ BIOPSIES  08-26-2012   Dr. Glennon Hamilton   . COLPOSCOPY    . GYNECOLOGIC CRYOSURGERY  1987  . MOUTH SURGERY     Social History   Tobacco Use  . Smoking status: Former Smoker    Packs/day: 0.25    Years: 3.00     Pack years: 0.75    Types: Cigarettes    Quit date: 08/20/1988    Years since quitting: 31.8  . Smokeless tobacco: Never Used  Substance Use Topics  . Alcohol use: Yes    Comment: 4-6 DRINKS A WEEK- WINE   Marital Status: Married   Review of Systems  Cardiovascular: Negative for chest pain, dyspnea on exertion and leg swelling.  Gastrointestinal: Negative for melena.   Objective:  Blood pressure 140/83, pulse 70, temperature 98.3 F (36.8 C), temperature source Temporal, resp. rate 16, height '5\' 1"'  (1.549 m), weight 138 lb (62.6 kg), SpO2 98 %. Body mass index is 26.07 kg/m.   Vitals with BMI 06/10/2020 04/29/2020 04/13/2020  Height '5\' 1"'  '5\' 1"'  -  Weight 138 lbs 140 lbs -  BMI 17.00 17.49 -  Systolic 449 675 916  Diastolic 83 87 71  Pulse 70 85 59       Physical Exam Vitals reviewed.  Constitutional:      Appearance: She is well-developed.  Cardiovascular:     Rate and Rhythm: Normal rate. Rhythm irregular. Occasional extrasystoles are present.    Pulses: Normal pulses and intact distal pulses.     Heart sounds: S1 normal and S2 normal. Murmur heard.   Midsystolic murmur is present with a grade of 2/6 at the apex.   Pulmonary:     Effort:  Pulmonary effort is normal. No accessory muscle usage or respiratory distress.     Breath sounds: Normal breath sounds.  Abdominal:     General: Bowel sounds are normal.     Palpations: Abdomen is soft.    Radiology: No results found.  Laboratory examination:   CMP Latest Ref Rng & Units 04/07/2020 09/18/2019 06/26/2019  Glucose 65 - 99 mg/dL 106(H) 94 -  BUN 6 - 24 mg/dL 12 9 -  Creatinine 0.57 - 1.00 mg/dL 0.94 0.61 -  Sodium 134 - 144 mmol/L 128(L) 131(L) -  Potassium 3.5 - 5.2 mmol/L 4.6 4.5 4.9  Chloride 96 - 106 mmol/L 88(L) 88(L) -  CO2 20 - 29 mmol/L 22 25 -  Calcium 8.7 - 10.2 mg/dL 9.8 9.4 -  Total Protein 6.0 - 8.5 g/dL - - -  Total Bilirubin 0.0 - 1.2 mg/dL - - -  Alkaline Phos 39 - 117 IU/L - - -  AST 0 - 40 IU/L -  - -  ALT 0 - 32 IU/L - - -   CBC Latest Ref Rng & Units 04/14/2010  WBC 4.0 - 10.5 K/uL 6.6  Hemoglobin 12.0 - 15.0 g/dL 13.2  Hematocrit 36.0 - 46.0 % 37.1  Platelets 150 - 400 K/uL 268   Lipid Panel Recent Labs    09/18/19 0837 03/31/20 0901  CHOL 214* 207*  TRIG 207* 191*  LDLCALC 113* 106*  HDL 65 68    External labs:  Labs 04/21/2020:  Sodium 134, potassium 5.6, BUN 7, creatinine 0.67, AST mildly elevated at 53, ALT mildly elevated at 51.  EGFR >60 mL.  A1c 5.8%.  TSH normal at 0.964.  Hb 12.8/HCT 37.3, platelets 287, normal indicis.  Total cholesterol 173, triglycerides 143, HDL 65, LDL 84.  Vitamin D 25 total 56, normal.   07/24/2018: Potassium 3.2, creatinine 0.6, EGFR greater than 90, AST 51, ALT normal at 45 (previously AST 74 and ALT 66 in January 2020).  TSH 1.2.  Normal H&H, MCH 33.1, CBC otherwise normal.  02/21/2018: Cholesterol 287, triglycerides 239, HDL 79, LDL 183.  Current Outpatient Medications on File Prior to Visit  Medication Sig Dispense Refill  . ALPRAZolam (XANAX) 0.5 MG tablet Take 0.25 mg by mouth daily as needed for anxiety.    . AMBULATORY NON FORMULARY MEDICATION Take 2 tablets by mouth daily. [facility] Immune Booster  at breakfast  500 mg each    . AMBULATORY NON FORMULARY MEDICATION Take 4 tablets by mouth daily. ALJ Herbal Respiratory  at breakfast    . atorvastatin (LIPITOR) 80 MG tablet TAKE 1 TABLET BY MOUTH EVERY DAY (Patient taking differently: Take 80 mg by mouth at bedtime.) 90 tablet 3  . augmented betamethasone dipropionate (DIPROLENE-AF) 0.05 % ointment Apply 1 application topically as needed.    . Calcium Carbonate-Vitamin D3 600-400 MG-UNIT TABS Take 1 tablet by mouth daily.    . Cholecalciferol (VITAMIN D3) 50 MCG (2000 UT) TABS Take 2,000 Units by mouth daily.    . Coenzyme Q10 (CO Q 10 PO) Take 300 mg by mouth daily.    Marland Kitchen diltiazem (CARDIZEM CD) 360 MG 24 hr capsule TAKE 1 CAPSULE BY MOUTH EVERY DAY 90 capsule 1  . ezetimibe  (ZETIA) 10 MG tablet TAKE 1 TABLET BY MOUTH EVERY DAY (Patient taking differently: Take 10 mg by mouth at bedtime.) 90 tablet 1  . fexofenadine (ALLEGRA) 180 MG tablet Take 180 mg by mouth daily.    . fluticasone (FLONASE) 50 MCG/ACT nasal spray  Place 1 spray into both nostrils daily.    . metoprolol succinate (TOPROL-XL) 100 MG 24 hr tablet Take 1 tablet (100 mg total) by mouth daily. Take with or immediately following a meal. (Patient taking differently: Take 100 mg by mouth at bedtime. Take with or immediately following a meal.) 90 tablet 3  . olmesartan (BENICAR) 40 MG tablet Take 1 tablet (40 mg total) by mouth daily. 90 tablet 1  . OVER THE COUNTER MEDICATION Take 2 capsules by mouth daily. Liver detox    . Propylene Glycol (SYSTANE COMPLETE OP) Place 1 drop into both eyes daily as needed (Dry eyes).    . TURMERIC CURCUMIN PO Take 1,000 mg by mouth daily. 500 mg each    . venlafaxine (EFFEXOR) 75 MG tablet Take 75 mg by mouth daily.      No current facility-administered medications on file prior to visit.    Cardiac Studies:   Lexiscan Sestamibi Stress Test 09/11/2018: Resting EKG normal sinus rhythm, LVH, occasional PVCs.  Stress EKG nondiagnostic due to pharmacologic stress testing.  Hypertensive throughout the study with resting blood pressure 160/110 mmHg. Myocardial perfusion imaging is normal. Left ventricular ejection fraction is  74% with normal wall motion. Low risk study.  4 day Ziopatch 07/31/2018: Dominant rhythm, Normal sinus rhythm. Episodes of brief atrial fibrillation with longest episode being on 06/17 at 1255 for 2 m 56s 112-182 bpm. Some episodes reported as A fib are questionable for Atrial tachycardia (06/19 and 06/18). A fib burden <1%. Patient triggered events for fluttering/racing correlated with A fib. 31 reported episodes of atrial tachycardia with fastest HR 204 bpm on 06/17 at 1437. Avg HR 147 bpm. Longest episode for 8 m on 06/16 at 1505. Rare PAC and PVC with 1  episode ventricular bigeminy for 3.4 s on 06/19.   Echocardiogram 02/26/2020: 1. Normal LV systolic function with EF 68%. Left ventricle cavity is normal in size. Normal global wall motion. Unable to evaluate diastolic function due to atrial fibrillation. Calculated EF 68%. 2. Left atrial cavity is severely dilated at 4.8 cm. 3. Structurally normal mitral valve. Mild (Grade I) mitral regurgitation. 4. Compared to 08/21/2011, atrial fibrillation is new.   Renal duplex 08/20/11: No renal artery stenosis. Normal kidney size bilateral.  Renal artery duplex  04/21/2020:  No evidence of renal artery occlusive disease in either renal artery.   Normal intrarenal vascular perfusion is noted in the right kidney.   Renal length is within normal limits for both kidneys.  No plaque observed in the abdominal aorta. Normal abdominal aorta flow velocities noted.       EKG:    EKG 06/10/2020: Normal sinus rhythm at rate of 72 bpm, left atrial abnormality, normal axis.  Nonspecific T abnormality, cannot exclude septal ischemia.  Normal QT interval.  PACs (2).   EKG 04/29/2020: Atrial fibrillation with controlled ventricular response at rate of 87 bpm, LVH with nonspecific ST-T abnormality, cannot exclude lateral ischemia. No significant change from EKG 04/01/2020: Atrial fibrillation with controlled ventricular response at the rate of 92 beats minute, normal axis.  LVH with repolarization abnormality, cannot exclude lateral ischemia.    EKG 04/22/2019: Sinus rhythm vs. Ectopic atrial rhythm at 72 bpm, normal axis, LVH. No evidence of ischemia.   Assessment:     ICD-10-CM   1. Paroxysmal atrial fibrillation (HCC)  I48.0 EKG 12-Lead    dronedarone (MULTAQ) 400 MG tablet  2. Essential hypertension  I10   3. Hyponatremia  E87.1   4. Hypercholesteremia  E78.00    CHA2DS2-VASc Score is 2.  Yearly risk of stroke: 2.3% (F, HTN).  Score of 1=0.6; 2=2.2; 3=3.2; 4=4.8; 5=7.2; 6=9.8; 7=>9.8) -(CHF; HTN; vasc  disease DM,  Female = 1; Age <65 =0; 65-74 = 1,  >75 =2; stroke/embolism= 2).   Meds ordered this encounter  Medications  . dronedarone (MULTAQ) 400 MG tablet    Sig: Take 1 tablet (400 mg total) by mouth 2 (two) times daily with a meal.    Dispense:  180 tablet    Refill:  3    Medications Discontinued During This Encounter  Medication Reason  . cetirizine (ZYRTEC) 10 MG tablet Error  . dronedarone (MULTAQ) 400 MG tablet Reorder  . rivaroxaban (XARELTO) 20 MG TABS tablet Completed Course    Recommendations:   Gwendolyn Thompson  is a 60 y.o. with hypertension, hyperlipidemia, history of basal cell carcinoma, with brief episodes of A fib and atrial tachycardia on 4 day monitor performed by PCP on 07/31/2018.  She was seen by me on 02/24/2020 and patient was back in atrial fibrillation although asymptomatic. She underwent unsuccessful Direct-current cardioversion on 04/13/2020 for atrial fibrillation with 3 shocks. She was started on Multitaq (5 days after Flecainide discontinued.   She has been on chronic anticoagulation with Xarelto.   Since last office visit 6 weeks ago, and since being Multaq, patient has felt well and thinks that she is back in sinus rhythm which she is today with EKG.  She is also lost some weight and blood pressure is well controlled, review of external labs also reveal her sodium levels improved.  I am beginning to wonder if hyponatremia was related to atrial fibrillation and accompanying natriuresis that occurs with ANP release.  Also hydrochlorothiazide has been discontinued.  Home blood pressure recordings are under excellent control, she is extremely nervous person, hence I did not make any changes to her blood pressure medications as well.  I will see her back in 6 months for follow-up.  I am extremely pleased with the progress and the fact that she feels well.  As her cardioembolic risk is low, we could certainly consider discontinuing Xarelto.  She had addition making  regarding anticoagulation discussed with the patient and she prefers to be off of Xarelto.   Adrian Prows, MD, [facility] 06/10/2020, 12:20 PM Office: [phone] Pager: [phone]

## 2020-06-14 ENCOUNTER — Other Ambulatory Visit: Payer: Self-pay

## 2020-06-14 ENCOUNTER — Other Ambulatory Visit: Payer: Self-pay | Admitting: Cardiology

## 2020-06-14 DIAGNOSIS — E7800 Pure hypercholesterolemia, unspecified: Secondary | ICD-10-CM

## 2020-06-14 DIAGNOSIS — I10 Essential (primary) hypertension: Secondary | ICD-10-CM

## 2020-06-14 MED ORDER — OLMESARTAN MEDOXOMIL 40 MG PO TABS: 40.0000 mg | ORAL_TABLET | Freq: Every day | ORAL | 1 refills | Status: DC

## 2020-06-14 MED ORDER — EZETIMIBE 10 MG PO TABS: 10.0000 mg | ORAL_TABLET | Freq: Every day | ORAL | 1 refills | Status: DC

## 2020-06-14 MED ORDER — OLMESARTAN MEDOXOMIL 40 MG PO TABS: 40.0000 mg | ORAL_TABLET | Freq: Every day | ORAL | 3 refills | Status: DC

## 2020-06-14 MED ORDER — EZETIMIBE 10 MG PO TABS: 10.0000 mg | ORAL_TABLET | Freq: Every day | ORAL | 3 refills | Status: DC

## 2020-10-06 ENCOUNTER — Other Ambulatory Visit: Payer: Self-pay

## 2020-10-06 MED ORDER — DILTIAZEM HCL ER COATED BEADS 360 MG PO CP24: 360.0000 mg | ORAL_CAPSULE | Freq: Every day | ORAL | 3 refills | Status: DC

## 2020-10-16 ENCOUNTER — Other Ambulatory Visit: Payer: Self-pay | Admitting: Cardiology

## 2020-10-16 DIAGNOSIS — E7800 Pure hypercholesterolemia, unspecified: Secondary | ICD-10-CM

## 2020-11-15 NOTE — Telephone Encounter (Signed)
From pt

## 2020-11-27 ENCOUNTER — Telehealth: Payer: Self-pay | Admitting: Cardiology

## 2020-11-27 DIAGNOSIS — I480 Paroxysmal atrial fibrillation: Secondary | ICD-10-CM | POA: Insufficient documentation

## 2020-11-27 MED ORDER — RIVAROXABAN 20 MG PO TABS: 20.0000 mg | ORAL_TABLET | Freq: Every day | ORAL | 2 refills | Status: DC

## 2020-11-27 NOTE — Telephone Encounter (Addendum)
60 y/o Caucasian female with hypertension, hyperlipidemia, h/o basal cell carcinoma, PAF (CHA2DS2VASc score 2, not on anticoagulation)  Patient is currently at [facility]. She called that she has been in fast heart rate 140s since last night. She missed her Multaq for possibly two doses, as well as her diltiazem. BP is 143/80 mmHg. She took her regular dose of Multaq 30 min ago. HR remains in 140s.   Ideally, if she remains in Afib, she should hold Multaq and get cardioverted. She could go to nearest emergency room and get cardioverted without TEE within 48 hrs of Afib onset and resume Xarelto. If she is not able to to do so and remains tachycardic s/o Afib, I recommend holding Multaq and continuing rate control with metoprolol succinate 100 mg daily and diltiazem 360 mg daily, resume Xarelto 20 mg daily (sent prescription to the pharmacy requested by patient in [facility]).  Patient should call our office next week to set up a f/u appt. If she remains in Afib, she may need TEE guided cardioversion, or cardioversion without TEE after 3-4 weeks of uninturrupted anticoagulation.  Time spent: 14 min   Nigel Mormon, MD Pager: [phone] Office: [phone]

## 2020-12-06 NOTE — Telephone Encounter (Signed)
From patient.

## 2020-12-06 NOTE — Telephone Encounter (Signed)
From patient, Message one of two

## 2020-12-10 ENCOUNTER — Ambulatory Visit: Payer: BC Managed Care – PPO | Admitting: Cardiology

## 2020-12-10 ENCOUNTER — Other Ambulatory Visit: Payer: Self-pay

## 2020-12-10 ENCOUNTER — Encounter: Payer: Self-pay | Admitting: Cardiology

## 2020-12-10 VITALS — BP 153/87 | HR 74 | Temp 98.6°F | Resp 17 | Ht 61.0 in | Wt 141.4 lb

## 2020-12-10 DIAGNOSIS — I480 Paroxysmal atrial fibrillation: Secondary | ICD-10-CM

## 2020-12-10 DIAGNOSIS — I10 Essential (primary) hypertension: Secondary | ICD-10-CM

## 2020-12-10 DIAGNOSIS — F411 Generalized anxiety disorder: Secondary | ICD-10-CM

## 2020-12-10 DIAGNOSIS — E7800 Pure hypercholesterolemia, unspecified: Secondary | ICD-10-CM

## 2020-12-10 MED ORDER — PROPRANOLOL HCL ER 120 MG PO CP24: 120.0000 mg | ORAL_CAPSULE | Freq: Every day | ORAL | 0 refills | Status: DC

## 2020-12-10 NOTE — Progress Notes (Signed)
Primary Physician:  Nelly Laurence, NP  Patient ID: Gwendolyn Thompson, female    DOB: 1960-05-14, 60 y.o.   MRN: [id number]  Subjective:    Chief Complaint  Patient presents with   Follow-up    6 MONTHS   Atrial Fibrillation   Hypertension    HPI: Gwendolyn Thompson  is a 60 y.o. female  with hypertension, hyperlipidemia, history of basal cell carcinoma, with brief episodes of A fib and atrial tachycardia on 4 day monitor performed by PCP on 07/31/2018.  She was seen by me on 02/24/2020 and patient was back in atrial fibrillation although asymptomatic. She underwent unsuccessful Direct-current cardioversion on 04/13/2020 for atrial fibrillation with 3 shocks. She was started on Multitaq. Patient had called after-hours clinic on 11/27/2020 and was recommended restarting Multaq as she had missed the dose.  She was also recommended to start Xarelto back in view of the fact that she may need recurrent cardioversion.    Presents for follow-up, states that after few hours of taking Multaq, she reverted back to sinus rhythm.  She has not had any further episodes of palpitations.  She is tolerating all her medications well.  She is wondering if she can stop Xarelto.   Past Medical History:  Diagnosis Date   Anxious depression    Atrial fibrillation ([place]) 09/17/2018   B12 deficiency-borderline 09/17/2013   Level was 210, July 2015   Cancer [facility])    basal cell removed from nose   Cervical dysplasia    Depression    Esophageal reflux    HTN (hypertension)    Hyperlipidemia    Hyperplastic colon polyp    Palpitations     Past Surgical History:  Procedure Laterality Date   CARDIOVERSION N/A 04/13/2020   Procedure: CARDIOVERSION;  Surgeon: Nigel Mormon, MD;  Location: [facility] ENDOSCOPY;  Service: Cardiovascular;  Laterality: N/A;   COLONOSCOPY W/ BIOPSIES  08-26-2012   Dr. Glennon Hamilton    COLPOSCOPY     GYNECOLOGIC CRYOSURGERY  1987   MOUTH SURGERY     Social History   Tobacco Use   Smoking  status: Former    Packs/day: 0.25    Years: 3.00    Pack years: 0.75    Types: Cigarettes    Quit date: 08/20/1988    Years since quitting: 32.3   Smokeless tobacco: Never  Substance Use Topics   Alcohol use: Yes    Comment: 4-6 DRINKS A WEEK- WINE   Marital Status: Married   Review of Systems  Cardiovascular:  Negative for chest pain, dyspnea on exertion and leg swelling.  Gastrointestinal:  Negative for melena.  Psychiatric/Behavioral:  The patient is nervous/anxious.   Objective:  Blood pressure (!) 153/87, pulse 74, temperature 98.6 F (37 C), temperature source Temporal, resp. rate 17, height '5\' 1"'  (1.549 m), weight 141 lb 6.4 oz (64.1 kg), SpO2 98 %. Body mass index is 26.72 kg/m.   Vitals with BMI 12/10/2020 12/10/2020 06/10/2020  Height - '5\' 1"'  '5\' 1"'   Weight - 141 lbs 6 oz 138 lbs  BMI - 54.65 03.54  Systolic 656 812 751  Diastolic 87 81 83  Pulse 74 76 70       Physical Exam Neck:     Vascular: No carotid bruit or JVD.  Cardiovascular:     Rate and Rhythm: Normal rate and regular rhythm.     Pulses: Intact distal pulses.     Heart sounds: Normal heart sounds. No murmur heard.   No  gallop.  Pulmonary:     Effort: Pulmonary effort is normal.     Breath sounds: Normal breath sounds.  Abdominal:     General: Bowel sounds are normal.     Palpations: Abdomen is soft.  Musculoskeletal:        General: No swelling.   Radiology: No results found.  Laboratory examination:   CMP Latest Ref Rng & Units 04/07/2020 09/18/2019 06/26/2019  Glucose 65 - 99 mg/dL 106(H) 94 -  BUN 6 - 24 mg/dL 12 9 -  Creatinine 0.57 - 1.00 mg/dL 0.94 0.61 -  Sodium 134 - 144 mmol/L 128(L) 131(L) -  Potassium 3.5 - 5.2 mmol/L 4.6 4.5 4.9  Chloride 96 - 106 mmol/L 88(L) 88(L) -  CO2 20 - 29 mmol/L 22 25 -  Calcium 8.7 - 10.2 mg/dL 9.8 9.4 -  Total Protein 6.0 - 8.5 g/dL - - -  Total Bilirubin 0.0 - 1.2 mg/dL - - -  Alkaline Phos 39 - 117 IU/L - - -  AST 0 - 40 IU/L - - -  ALT 0 - 32  IU/L - - -   CBC Latest Ref Rng & Units 04/14/2010  WBC 4.0 - 10.5 K/uL 6.6  Hemoglobin 12.0 - 15.0 g/dL 13.2  Hematocrit 36.0 - 46.0 % 37.1  Platelets 150 - 400 K/uL 268   Lipid Panel Recent Labs    03/31/20 0901  CHOL 207*  TRIG 191*  LDLCALC 106*  HDL 68    External labs:  Labs 10/28/2020:  A1c 5.7%.  Total cholesterol 194, triglycerides 140, HDL 82, LDL 84.  Sodium 132, potassium 5.1, chloride 93, BUN 7, creatinine 0.75, EGFR greater than 60 mL.  AST mildly elevated at 52.  Labs 04/21/2020:  Sodium 134, potassium 5.6, BUN 7, creatinine 0.67, AST mildly elevated at 53, ALT mildly elevated at 51.  EGFR >60 mL.  A1c 5.8%.  TSH normal at 0.964.  Hb 12.8/HCT 37.3, platelets 287, normal indicis.  Total cholesterol 173, triglycerides 143, HDL 65, LDL 84.  Vitamin D 25 total 56, normal.  02/21/2018: Cholesterol 287, triglycerides 239, HDL 79, LDL 183.  Current Outpatient Medications on File Prior to Visit  Medication Sig Dispense Refill   ALPRAZolam (XANAX) 0.5 MG tablet Take 0.25 mg by mouth daily as needed for anxiety.     AMBULATORY NON FORMULARY MEDICATION Take 2 tablets by mouth daily. AHCC Immune Booster  at breakfast  500 mg each     AMBULATORY NON FORMULARY MEDICATION Take 4 tablets by mouth daily. ALJ Herbal Respiratory  at breakfast     atorvastatin (LIPITOR) 80 MG tablet TAKE 1 TABLET BY MOUTH EVERY DAY 90 tablet 3   augmented betamethasone dipropionate (DIPROLENE-AF) 0.05 % ointment Apply 1 application topically as needed.     Calcium Carbonate-Vitamin D3 600-400 MG-UNIT TABS Take 1 tablet by mouth daily.     Cholecalciferol (VITAMIN D3) 50 MCG (2000 UT) TABS Take 2,000 Units by mouth daily.     Coenzyme Q10 (CO Q 10 PO) Take 300 mg by mouth daily.     diltiazem (CARDIZEM CD) 360 MG 24 hr capsule Take 1 capsule (360 mg total) by mouth daily. 90 capsule 3   dronedarone (MULTAQ) 400 MG tablet Take 1 tablet (400 mg total) by mouth 2 (two) times daily with a  meal. 180 tablet 3   ezetimibe (ZETIA) 10 MG tablet TAKE 1 TABLET BY MOUTH EVERYDAY AT BEDTIME 90 tablet 1   fexofenadine (ALLEGRA) 180 MG tablet Take  180 mg by mouth daily.     fluticasone (FLONASE) 50 MCG/ACT nasal spray Place 1 spray into both nostrils daily.     metoprolol succinate (TOPROL-XL) 100 MG 24 hr tablet Take 1 tablet (100 mg total) by mouth daily. Take with or immediately following a meal. (Patient taking differently: Take 100 mg by mouth at bedtime. Take with or immediately following a meal.) 90 tablet 3   olmesartan (BENICAR) 40 MG tablet Take 1 tablet (40 mg total) by mouth daily. 90 tablet 3   OVER THE COUNTER MEDICATION Take 2 capsules by mouth daily. Liver detox     Propylene Glycol (SYSTANE COMPLETE OP) Place 1 drop into both eyes daily as needed (Dry eyes).     TURMERIC CURCUMIN PO Take 1,000 mg by mouth daily. 500 mg each     venlafaxine (EFFEXOR) 75 MG tablet Take 75 mg by mouth daily.      No current facility-administered medications on file prior to visit.    Cardiac Studies:   Lexiscan Sestamibi Stress Test 09/11/2018: Resting EKG normal sinus rhythm, LVH, occasional PVCs.  Stress EKG nondiagnostic due to pharmacologic stress testing.  Hypertensive throughout the study with resting blood pressure 160/110 mmHg. Myocardial perfusion imaging is normal. Left ventricular ejection fraction is  74% with normal wall motion. Low risk study.  4 day Ziopatch 07/31/2018: Dominant rhythm, Normal sinus rhythm. Episodes of brief atrial fibrillation with longest episode being on 06/17 at 1255 for 2 m 56s 112-182 bpm. Some episodes reported as A fib are questionable for Atrial tachycardia (06/19 and 06/18). A fib burden <1%. Patient triggered events for fluttering/racing correlated with A fib. 31 reported episodes of atrial tachycardia with fastest HR 204 bpm on 06/17 at 1437. Avg HR 147 bpm. Longest episode for 8 m on 06/16 at 1505. Rare PAC and PVC with 1 episode ventricular bigeminy  for 3.4 s on 06/19.   Echocardiogram 02/26/2020: 1. Normal LV systolic function with EF 68%. Left ventricle cavity is normal in size. Normal global wall motion. Unable to evaluate diastolic function due to atrial fibrillation. Calculated EF 68%. 2. Left atrial cavity is severely dilated at 4.8 cm. 3. Structurally normal mitral valve. Mild (Grade I) mitral regurgitation. 4. Compared to 08/21/2011, atrial fibrillation is new.   Renal duplex 08/20/11: No renal artery stenosis. Normal kidney size bilateral.  Renal artery duplex  04/21/2020:  No evidence of renal artery occlusive disease in either renal artery.   Normal intrarenal vascular perfusion is noted in the right kidney.   Renal length is within normal limits for both kidneys.  No plaque observed in the abdominal aorta. Normal abdominal aorta flow velocities noted.     EKG:   EKG 12/10/2020: Normal sinus rhythm at rate of 73 bpm, left atrial enlargement, normal axis.  Incomplete right bundle branch block.  Nonspecific T abnormality.  Probably normal.  No significant change from 06/10/2020.   EKG 04/29/2020: Atrial fibrillation with controlled ventricular response at rate of 87 bpm, LVH with nonspecific ST-T abnormality, cannot exclude lateral ischemia. No significant change from EKG 04/01/2020: Atrial fibrillation with controlled ventricular response at the rate of 92 beats minute, normal axis.  LVH with repolarization abnormality, cannot exclude lateral ischemia.     Assessment:     ICD-10-CM   1. Paroxysmal atrial fibrillation (HCC)  I48.0 EKG 12-Lead    propranolol ER (INDERAL LA) 120 MG 24 hr capsule    2. Essential hypertension  I10     3. Hypercholesteremia  E78.00     4. Generalized anxiety disorder  F41.1 propranolol ER (INDERAL LA) 120 MG 24 hr capsule     CHA2DS2-VASc Score is 2.  Yearly risk of stroke: 2.3% (F, HTN).  Score of 1=0.6; 2=2.2; 3=3.2; 4=4.8; 5=7.2; 6=9.8; 7=>9.8) -(CHF; HTN; vasc disease DM,  Female = 1;  Age <65 =0; 65-74 = 1,  >75 =2; stroke/embolism= 2).   Meds ordered this encounter  Medications   propranolol ER (INDERAL LA) 120 MG 24 hr capsule    Sig: Take 1 capsule (120 mg total) by mouth daily. Stop metoprolol and try this medication and if you like it please let us know and we will send in a new prescription.    Dispense:  30 capsule    Refill:  0     Medications Discontinued During This Encounter  Medication Reason   rivaroxaban (XARELTO) 20 MG TABS tablet Completed Course     Recommendations:   Gwendolyn Thompson  is a 60 y.o. with hypertension, hyperlipidemia, history of basal cell carcinoma, with brief episodes of A fib and atrial tachycardia on 4 day monitor performed by PCP on 07/31/2018.  She was seen by me on 02/24/2020 and patient was back in atrial fibrillation although asymptomatic. She underwent unsuccessful Direct-current cardioversion on 04/13/2020 for atrial fibrillation with 3 shocks. She was started on Multitaq . Patient had called after-hours clinic on 11/27/2020 and was recommended restarting Multaq as she had missed the dose.  She was also recommended to start Xarelto back in view of the fact that she may need recurrent cardioversion.  After she restarted Multaq, she was back in sinus rhythm within a few hours.  She is presently asymptomatic.  Home blood pressure recordings are under excellent control, she is extremely nervous person, hence I did not make any changes to her blood pressure medications as well.  I will see her back in  1 year.  She is extremely anxious and nervous person, in view of generalized anxiety disorder, propranolol LA may be a better choice than metoprolol, I will give samples of propranolol LA 120 mg daily and she will try this out and if she indeed likes this, we could switch her from metoprolol to propranolol LA.  As her cardioembolic risk is low, and as per patient wishes and discussions, we have discontinued Xarelto.  She will continue to  use Multaq regularly and not miss the dose.  I reviewed her lipid profile testing, lipids and excellent control.  She is tolerating high intensity statin along with Zetia.   Adrian Prows, MD, [facility] 12/10/2020, 3:23 PM Office: [phone] Pager: [phone]

## 2020-12-19 DIAGNOSIS — I480 Paroxysmal atrial fibrillation: Secondary | ICD-10-CM

## 2020-12-19 DIAGNOSIS — F411 Generalized anxiety disorder: Secondary | ICD-10-CM

## 2020-12-20 ENCOUNTER — Other Ambulatory Visit: Payer: Self-pay | Admitting: Cardiology

## 2020-12-20 DIAGNOSIS — F411 Generalized anxiety disorder: Secondary | ICD-10-CM

## 2020-12-20 DIAGNOSIS — I480 Paroxysmal atrial fibrillation: Secondary | ICD-10-CM

## 2020-12-20 MED ORDER — PROPRANOLOL HCL ER 120 MG PO CP24: 120.0000 mg | ORAL_CAPSULE | Freq: Every day | ORAL | 1 refills | Status: DC

## 2020-12-20 NOTE — Telephone Encounter (Signed)
From pt

## 2020-12-20 NOTE — Telephone Encounter (Signed)
ICD-10-CM   1. Paroxysmal atrial fibrillation (HCC)  I48.0 propranolol ER (INDERAL LA) 120 MG 24 hr capsule    2. Generalized anxiety disorder  F41.1 propranolol ER (INDERAL LA) 120 MG 24 hr capsule     Medications Discontinued During This Encounter  Medication Reason   metoprolol succinate (TOPROL-XL) 100 MG 24 hr tablet Change in therapy   propranolol ER (INDERAL LA) 120 MG 24 hr capsule     Meds ordered this encounter  Medications   propranolol ER (INDERAL LA) 120 MG 24 hr capsule    Sig: Take 1 capsule (120 mg total) by mouth daily. Stop metoprolol and try this medication and if you like it please let us know and we will send in a new prescription.    Dispense:  90 capsule    Refill:  1     Adrian Prows, MD, [facility] 12/20/2020, 10:42 AM Office: [phone] Fax: [phone] Pager: [phone]

## 2021-02-23 ENCOUNTER — Other Ambulatory Visit: Payer: Self-pay

## 2021-02-23 ENCOUNTER — Encounter: Payer: Self-pay | Admitting: Cardiology

## 2021-02-23 DIAGNOSIS — E7800 Pure hypercholesterolemia, unspecified: Secondary | ICD-10-CM

## 2021-02-23 DIAGNOSIS — I10 Essential (primary) hypertension: Secondary | ICD-10-CM

## 2021-02-23 DIAGNOSIS — F411 Generalized anxiety disorder: Secondary | ICD-10-CM

## 2021-02-23 DIAGNOSIS — I480 Paroxysmal atrial fibrillation: Secondary | ICD-10-CM

## 2021-02-23 MED ORDER — OLMESARTAN MEDOXOMIL 40 MG PO TABS: 40.0000 mg | ORAL_TABLET | Freq: Every day | ORAL | 1 refills | Status: DC

## 2021-02-23 MED ORDER — ATORVASTATIN CALCIUM 80 MG PO TABS: 80.0000 mg | ORAL_TABLET | Freq: Every day | ORAL | 1 refills | Status: DC

## 2021-02-23 MED ORDER — EZETIMIBE 10 MG PO TABS: ORAL_TABLET | ORAL | 1 refills | Status: DC

## 2021-02-23 MED ORDER — PROPRANOLOL HCL ER 120 MG PO CP24: 120.0000 mg | ORAL_CAPSULE | Freq: Every day | ORAL | 1 refills | Status: DC

## 2021-02-23 MED ORDER — DILTIAZEM HCL ER COATED BEADS 360 MG PO CP24: 360.0000 mg | ORAL_CAPSULE | Freq: Every day | ORAL | 1 refills | Status: DC

## 2021-02-25 ENCOUNTER — Encounter: Payer: Self-pay | Admitting: Obstetrics & Gynecology

## 2021-02-25 ENCOUNTER — Ambulatory Visit (INDEPENDENT_AMBULATORY_CARE_PROVIDER_SITE_OTHER): Payer: BC Managed Care – PPO | Admitting: Obstetrics & Gynecology

## 2021-02-25 ENCOUNTER — Other Ambulatory Visit: Payer: Self-pay

## 2021-02-25 VITALS — BP 144/84 | Ht 61.0 in | Wt 142.0 lb

## 2021-02-25 DIAGNOSIS — Z780 Asymptomatic menopausal state: Secondary | ICD-10-CM

## 2021-02-25 DIAGNOSIS — Z01419 Encounter for gynecological examination (general) (routine) without abnormal findings: Secondary | ICD-10-CM | POA: Diagnosis not present

## 2021-02-25 NOTE — Progress Notes (Signed)
Gwendolyn Thompson [birth year] [id number]   History:    61 y.o. G0 Same sex wife, Gwendolyn Thompson.   RP:  Established patient presenting for annual gyn exam    HPI: Postmenopause, well on no hormone replacement therapy.  No postmenopausal bleeding.  No pelvic pain.  Pap negative 02/2020.  Urine and bowel movements normal.  Breasts normal.  Screening Mammo Neg 02/2020, scheduled. Body mass index 26.83.  Walking daily and playing pickle ball.  Health labs with family physician.  Diagnosed with A. fib in 2020, followed by cardio.  Colonoscopy July 2014, polyp removed, repeat in 2024.  BD normal 04/2017.  Past medical history,surgical history, family history and social history were all reviewed and documented in the EPIC chart.  Gynecologic History No LMP recorded. Patient is postmenopausal.  Obstetric History OB History  Gravida Para Term Preterm AB Living  0            SAB IAB Ectopic Multiple Live Births                ROS: A ROS was performed and pertinent positives and negatives are included in the history.  GENERAL: No fevers or chills. HEENT: No change in vision, no earache, sore throat or sinus congestion. NECK: No pain or stiffness. CARDIOVASCULAR: No chest pain or pressure. No palpitations. PULMONARY: No shortness of breath, cough or wheeze. GASTROINTESTINAL: No abdominal pain, nausea, vomiting or diarrhea, melena or bright red blood per rectum. GENITOURINARY: No urinary frequency, urgency, hesitancy or dysuria. MUSCULOSKELETAL: No joint or muscle pain, no back pain, no recent trauma. DERMATOLOGIC: No rash, no itching, no lesions. ENDOCRINE: No polyuria, polydipsia, no heat or cold intolerance. No recent change in weight. HEMATOLOGICAL: No anemia or easy bruising or bleeding. NEUROLOGIC: No headache, seizures, numbness, tingling or weakness. PSYCHIATRIC: No depression, no loss of interest in normal activity or change in sleep pattern.     Exam:   BP (!) 144/84    Ht 5\' 1"  (1.549 m)    Wt  142 lb (64.4 kg)    BMI 26.83 kg/m   Body mass index is 26.83 kg/m.  General appearance : Well developed well nourished female. No acute distress HEENT: Eyes: no retinal hemorrhage or exudates,  Neck supple, trachea midline, no carotid bruits, no thyroidmegaly Lungs: Clear to auscultation, no rhonchi or wheezes, or rib retractions  Heart: Regular rate and rhythm, no murmurs or gallops Breast:Examined in sitting and supine position were symmetrical in appearance, no palpable masses or tenderness,  no skin retraction, no nipple inversion, no nipple discharge, no skin discoloration, no axillary or supraclavicular lymphadenopathy Abdomen: no palpable masses or tenderness, no rebound or guarding Extremities: no edema or skin discoloration or tenderness  Pelvic: Vulva: Normal             Vagina: No gross lesions or discharge  Cervix: No gross lesions or discharge.    Uterus  AV, normal size, shape and consistency, non-tender and mobile  Adnexa  Without masses or tenderness  Anus: Normal   Assessment/Plan:  61 y.o. female for annual exam   1. Well female exam with routine gynecological exam Postmenopause, well on no hormone replacement therapy.  No postmenopausal bleeding.  No pelvic pain.  Pap negative 02/2020.  Urine and bowel movements normal.  Breasts normal.  Screening Mammo Neg 02/2020, scheduled. Body mass index 26.83.  Walking daily and playing pickle ball.  Health labs with family physician.  Diagnosed with A. fib in 2020, followed by cardio.  Colonoscopy July 2014, polyp removed, repeat in 2024.  BD normal 04/2017.  2. Postmenopausal  Postmenopause, well on no hormone replacement therapy.  No postmenopausal bleeding.  No pelvic pain. BD normal 04/2017.  Princess Bruins MD, 2:23 PM 02/25/2021

## 2021-03-06 ENCOUNTER — Other Ambulatory Visit: Payer: Self-pay | Admitting: Cardiology

## 2021-03-06 DIAGNOSIS — I480 Paroxysmal atrial fibrillation: Secondary | ICD-10-CM

## 2021-03-06 DIAGNOSIS — I10 Essential (primary) hypertension: Secondary | ICD-10-CM

## 2021-03-07 ENCOUNTER — Encounter: Payer: Self-pay | Admitting: Cardiology

## 2021-04-18 ENCOUNTER — Other Ambulatory Visit: Payer: Self-pay | Admitting: Obstetrics & Gynecology

## 2021-04-18 DIAGNOSIS — Z1231 Encounter for screening mammogram for malignant neoplasm of breast: Secondary | ICD-10-CM

## 2021-04-20 ENCOUNTER — Ambulatory Visit
Admission: RE | Admit: 2021-04-20 | Discharge: 2021-04-20 | Disposition: A | Discharge status: Home / Self Care | Payer: BC Managed Care – PPO | Source: Ambulatory Visit | Attending: Obstetrics & Gynecology | Admitting: Obstetrics & Gynecology

## 2021-04-20 DIAGNOSIS — Z1231 Encounter for screening mammogram for malignant neoplasm of breast: Secondary | ICD-10-CM

## 2021-05-08 ENCOUNTER — Encounter: Payer: Self-pay | Admitting: Cardiology

## 2021-05-10 ENCOUNTER — Other Ambulatory Visit: Payer: Self-pay | Admitting: Cardiology

## 2021-05-10 DIAGNOSIS — I480 Paroxysmal atrial fibrillation: Secondary | ICD-10-CM

## 2021-05-11 NOTE — Telephone Encounter (Signed)
Refill request

## 2021-08-16 ENCOUNTER — Encounter: Payer: Self-pay | Admitting: Cardiology

## 2021-08-17 NOTE — Telephone Encounter (Signed)
From pt

## 2021-08-24 ENCOUNTER — Encounter: Payer: Self-pay | Admitting: Cardiology

## 2021-08-24 NOTE — Telephone Encounter (Signed)
From patient.

## 2021-08-25 NOTE — Telephone Encounter (Signed)
From patient.

## 2021-09-02 ENCOUNTER — Encounter: Payer: Self-pay | Admitting: Cardiology

## 2021-09-04 ENCOUNTER — Other Ambulatory Visit: Payer: Self-pay | Admitting: Cardiology

## 2021-10-11 ENCOUNTER — Encounter: Payer: Self-pay | Admitting: Cardiology

## 2021-11-23 ENCOUNTER — Other Ambulatory Visit: Payer: Self-pay | Admitting: Cardiology

## 2021-11-23 DIAGNOSIS — I480 Paroxysmal atrial fibrillation: Secondary | ICD-10-CM

## 2021-11-23 DIAGNOSIS — F411 Generalized anxiety disorder: Secondary | ICD-10-CM

## 2021-11-23 DIAGNOSIS — E7800 Pure hypercholesterolemia, unspecified: Secondary | ICD-10-CM

## 2021-12-09 ENCOUNTER — Ambulatory Visit: Payer: BC Managed Care – PPO | Admitting: Cardiology

## 2021-12-20 ENCOUNTER — Ambulatory Visit: Payer: BC Managed Care – PPO | Admitting: Cardiology

## 2021-12-20 ENCOUNTER — Encounter: Payer: Self-pay | Admitting: Cardiology

## 2021-12-20 VITALS — BP 122/83 | HR 85 | Ht 61.0 in | Wt 138.4 lb

## 2021-12-20 DIAGNOSIS — I10 Essential (primary) hypertension: Secondary | ICD-10-CM

## 2021-12-20 DIAGNOSIS — E7800 Pure hypercholesterolemia, unspecified: Secondary | ICD-10-CM

## 2021-12-20 DIAGNOSIS — E871 Hypo-osmolality and hyponatremia: Secondary | ICD-10-CM

## 2021-12-20 DIAGNOSIS — Z8679 Personal history of other diseases of the circulatory system: Secondary | ICD-10-CM

## 2021-12-20 NOTE — Progress Notes (Signed)
Primary Physician:  Nelly Laurence, NP  Patient ID: Gwendolyn Thompson, female    DOB: 03-16-1960, 61 y.o.   MRN: [id number]  Subjective:    Chief Complaint  Patient presents with   Atrial Fibrillation   Follow-up    HPI: Gwendolyn Thompson  is a 61 y.o. female  with hypertension, hyperlipidemia, history of basal cell carcinoma, with brief episodes of A fib and atrial tachycardia on 4 day monitor performed by PCP on 07/31/2018. ) On 02/24/2020 and patient was back in atrial fibrillation although asymptomatic. She underwent unsuccessful Direct-current cardioversion on 04/13/2020 for atrial fibrillation with 3 shocks.  She had 1 more episode of atrial fibrillation and spontaneously converted to sinus rhythm on Multaq.  She is presently doing well, has not had any further palpitations or recurrence of atrial fibrillation, as per her preference she is off of Xarelto.  No specific complaints today.   wondering if she can stop Xarelto.   Past Medical History:  Diagnosis Date   Anxious depression    Atrial fibrillation ([place]) 09/17/2018   B12 deficiency-borderline 09/17/2013   Level was 210, July 2015   [facility])    basal cell removed from nose   Cervical dysplasia    Depression    Esophageal reflux    HTN (hypertension)    Hyperlipidemia    Hyperplastic colon polyp    Palpitations     Past Surgical History:  Procedure Laterality Date   CARDIOVERSION N/A 04/13/2020   Procedure: CARDIOVERSION;  Surgeon: Nigel Mormon, MD;  Location: [facility];  Service: Cardiovascular;  Laterality: N/A;   COLONOSCOPY W/ BIOPSIES  08-26-2012   Dr. Glennon Hamilton    COLPOSCOPY     GYNECOLOGIC CRYOSURGERY  1987   MOUTH SURGERY     Social History   Tobacco Use   Smoking status: Former    Packs/day: 0.25    Years: 3.00    Total pack years: 0.75    Types: Cigarettes    Quit date: 08/20/1988    Years since quitting: 33.3   Smokeless tobacco: Never  Substance Use Topics   Alcohol use: Yes    Comment:  4-6 DRINKS A WEEK- WINE   Marital Status: Married   Review of Systems  Cardiovascular:  Negative for chest pain, dyspnea on exertion and leg swelling.   Objective:  Blood pressure 122/83, pulse 85, height _0  (1.549 m), weight 138 lb 6.4 oz (62.8 kg), SpO2 98 %. Body mass index is 26.15 kg/m.      12/20/2021    1:45 PM 02/25/2021    2:10 PM 12/10/2020    2:32 PM  Vitals with BMI  Height _1  _2    Weight 138 lbs 6 oz 142 lbs   BMI 29.51 88.41   Systolic 660 630 160  Diastolic 83 84 87  Pulse 85  74       Physical Exam Neck:     Vascular: No carotid bruit or JVD.  Cardiovascular:     Rate and Rhythm: Normal rate and regular rhythm.     Pulses: Intact distal pulses.     Heart sounds: Normal heart sounds. No murmur heard.    No gallop.  Pulmonary:     Effort: Pulmonary effort is normal.     Breath sounds: Normal breath sounds.  Abdominal:     General: Bowel sounds are normal.     Palpations: Abdomen is soft.  Musculoskeletal:     Right lower leg: No edema.  Left lower leg: No edema.    Radiology: No results found.  Laboratory examination:      Latest Ref Rng & Units 04/07/2020   10:40 AM 09/18/2019    8:37 AM 06/26/2019    9:23 AM  CMP  Glucose 65 - 99 mg/dL 106  94    BUN 6 - 24 mg/dL 12  9    Creatinine 0.57 - 1.00 mg/dL 0.94  0.61    Sodium 134 - 144 mmol/L 128  131    Potassium 3.5 - 5.2 mmol/L 4.6  4.5  4.9   Chloride 96 - 106 mmol/L 88  88    CO2 20 - 29 mmol/L 22  25    Calcium 8.7 - 10.2 mg/dL 9.8  9.4        Latest Ref Rng & Units 04/14/2010   10:30 PM  CBC  WBC 4.0 - 10.5 K/uL 6.6   Hemoglobin 12.0 - 15.0 g/dL 13.2   Hematocrit 36.0 - 46.0 % 37.1   Platelets 150 - 400 K/uL 268    External labs:  Labs 10/03/2021:  Sodium 128, potassium 4.7, BUN 6, creatinine 0.64, EGFR >60 mL, serum glucose 94 mg.  Hb 10.5/HCT 30.5, platelets 389.  Total cholesterol 182, triglycerides 191, HDL 66, LDL 86.  TSH 0.95.  Labs 10/28/2020:  A1c  5.7%.  Total cholesterol 194, triglycerides 140, HDL 82, LDL 84.  Sodium 132, potassium 5.1, chloride 93, BUN 7, creatinine 0.75, EGFR greater than 60 mL.  AST mildly elevated at 52.  02/21/2018: Cholesterol 287, triglycerides 239, HDL 79, LDL 183.  Allergies  Allergen Reactions   Penicillins Rash    Current Outpatient Medications:    ALPRAZolam (XANAX) 0.5 MG tablet, Take 0.25 mg by mouth daily as needed for anxiety., Disp: , Rfl:    AMBULATORY NON FORMULARY MEDICATION, Take 2 tablets by mouth daily. [facility] Immune Booster  at breakfast  500 mg each, Disp: , Rfl:    AMBULATORY NON FORMULARY MEDICATION, Take 4 tablets by mouth daily. [facility] Herbal Respiratory  at breakfast, Disp: , Rfl:    atorvastatin (LIPITOR) 80 MG tablet, TAKE 1 TABLET BY MOUTH EVERY DAY, Disp: 90 tablet, Rfl: 1   busPIRone (BUSPAR) 10 MG tablet, Take 10 mg by mouth 3 (three) times daily., Disp: , Rfl:    Calcium Carbonate-Vitamin D3 600-400 MG-UNIT TABS, Take 1 tablet by mouth daily., Disp: , Rfl:    Cholecalciferol (VITAMIN D3) 50 MCG (2000 UT) TABS, Take 2,000 Units by mouth daily., Disp: , Rfl:    Coenzyme Q10 (CO Q 10 PO), Take 300 mg by mouth daily., Disp: , Rfl:    diltiazem (CARDIZEM CD) 360 MG 24 hr capsule, Take 1 capsule (360 mg total) by mouth daily., Disp: 90 capsule, Rfl: 1   ezetimibe (ZETIA) 10 MG tablet, TAKE 1 TABLET BY MOUTH EVERYDAY AT BEDTIME, Disp: 90 tablet, Rfl: 1   fexofenadine (ALLEGRA) 180 MG tablet, Take 180 mg by mouth daily., Disp: , Rfl:    fluticasone (FLONASE) 50 MCG/ACT nasal spray, Place 1 spray into both nostrils daily., Disp: , Rfl:    MULTAQ 400 MG tablet, TAKE 1 TABLET (400 MG TOTAL) BY MOUTH 2 (TWO) TIMES DAILY WITH A MEAL., Disp: 180 tablet, Rfl: 3   olmesartan (BENICAR) 40 MG tablet, Take 1 tablet (40 mg total) by mouth daily., Disp: 90 tablet, Rfl: 1   OVER THE COUNTER MEDICATION, Take 2 capsules by mouth daily. Liver detox, Disp: , Rfl:  pantoprazole (PROTONIX) 40 MG tablet, Take  40 mg by mouth daily., Disp: , Rfl:    propranolol ER (INDERAL LA) 120 MG 24 hr capsule, PLEASE SEE ATTACHED FOR DETAILED DIRECTIONS, Disp: 90 capsule, Rfl: 1   Propylene Glycol (SYSTANE COMPLETE OP), Place 1 drop into both eyes daily as needed (Dry eyes)., Disp: , Rfl:    TURMERIC CURCUMIN PO, Take 1,000 mg by mouth daily. 500 mg each, Disp: , Rfl:    venlafaxine (EFFEXOR) 75 MG tablet, Take 75 mg by mouth daily. , Disp: , Rfl:     Cardiac Studies:   Lexiscan Sestamibi Stress Test 09/11/2018: Resting EKG normal sinus rhythm, LVH, occasional PVCs.  Stress EKG nondiagnostic due to pharmacologic stress testing.  Hypertensive throughout the study with resting blood pressure 160/110 mmHg. Myocardial perfusion imaging is normal. Left ventricular ejection fraction is  74% with normal wall motion. Low risk study.  4 day Ziopatch 07/31/2018: Dominant rhythm, Normal sinus rhythm. Episodes of brief atrial fibrillation with longest episode being on 06/17 at 1255 for 2 m 56s 112-182 bpm. Some episodes reported as A fib are questionable for Atrial tachycardia (06/19 and 06/18). A fib burden <1%. Patient triggered events for fluttering/racing correlated with A fib. 31 reported episodes of atrial tachycardia with fastest HR 204 bpm on 06/17 at 1437. Avg HR 147 bpm. Longest episode for 8 m on 06/16 at 1505. Rare PAC and PVC with 1 episode ventricular bigeminy for 3.4 s on 06/19.   Echocardiogram 02/26/2020: 1. Normal LV systolic function with EF 68%. Left ventricle cavity is normal in size. Normal global wall motion. Unable to evaluate diastolic function due to atrial fibrillation. Calculated EF 68%. 2. Left atrial cavity is severely dilated at 4.8 cm. 3. Structurally normal mitral valve. Mild (Grade I) mitral regurgitation. 4. Compared to 08/21/2011, atrial fibrillation is new.   Renal duplex 08/20/11: No renal artery stenosis. Normal kidney size bilateral.  Renal artery duplex  04/21/2020:  No evidence of  renal artery occlusive disease in either renal artery.   Normal intrarenal vascular perfusion is noted in the right kidney.   Renal length is within normal limits for both kidneys.  No plaque observed in the abdominal aorta. Normal abdominal aorta flow velocities noted.     EKG:   EKG 12/10/2020: Normal sinus rhythm at rate of 73 bpm, left atrial enlargement, normal axis.  Incomplete right bundle branch block.  Nonspecific T abnormality.  Probably normal.  No significant change from 06/10/2020.   EKG 04/29/2020: Atrial fibrillation with controlled ventricular response at rate of 87 bpm, LVH with nonspecific ST-T abnormality, cannot exclude lateral ischemia. No significant change from EKG 04/01/2020: Atrial fibrillation with controlled ventricular response at the rate of 92 beats minute, normal axis.  LVH with repolarization abnormality, cannot exclude lateral ischemia.     Assessment:     ICD-10-CM   1. History of atrial fibrillation  Z86.79 EKG 12-Lead    2. Essential hypertension  I10     3. Hypercholesteremia  E78.00     4. Hyponatremia  E87.1      CHA2DS2-VASc Score is 2.  Yearly risk of stroke: 2.3% (F, HTN).  Score of 1=0.6; 2=2.2; 3=3.2; 4=4.8; 5=7.2; 6=9.8; 7=>9.8) -(CHF; HTN; vasc disease DM,  Female = 1; Age <65 =0; 65-74 = 1,  >75 =2; stroke/embolism= 2).   No orders of the defined types were placed in this encounter.   There are no discontinued medications.   Recommendations:   Gwendolyn Thompson  is a 61 y.o. with hypertension, hyperlipidemia, history of basal cell carcinoma, with brief episodes of A fib and atrial tachycardia on 4 day monitor performed by PCP on 07/31/2018. ) On 02/24/2020 and patient was back in atrial fibrillation although asymptomatic. She underwent unsuccessful Direct-current cardioversion on 04/13/2020 for atrial fibrillation with 3 shocks.  She had 1 more episode of atrial fibrillation and spontaneously converted to sinus rhythm on Multaq.  1.  History of atrial fibrillation Patient presently maintained sinus rhythm.  Has no specific complaints today and has not had any further palpitations, tolerating Multaq without any side effects.  She does not want to be on anticoagulation.  2. Essential hypertension Blood pressure is under excellent control.  3. Hypercholesteremia Lipids reviewed, at goal.  Continue present medical therapy including statins and Zetia.  4. Hyponatremia Reviewed her external labs, she has persistent hyponatremia and also has anemia that has started about 6 to 8 months ago.  She is scheduled for GI scope soon.  With regard to hyponatremia, this needs to be worked up and she has an appointment to see her PCP.  This may also be related to ARB, if sodium levels do not come back up, could consider discontinuing ARB and switching her to a different agent.  Otherwise stable from cardiac standpoint, I will see her back in 1 year or sooner if problems.   Adrian Prows, MD, [facility] 12/20/2021, 2:11 PM Office: [phone] Pager: [phone]

## 2022-01-01 ENCOUNTER — Encounter: Payer: Self-pay | Admitting: Cardiology

## 2022-01-02 ENCOUNTER — Other Ambulatory Visit: Payer: Self-pay

## 2022-01-02 ENCOUNTER — Other Ambulatory Visit: Payer: Self-pay | Admitting: Cardiology

## 2022-01-02 DIAGNOSIS — I480 Paroxysmal atrial fibrillation: Secondary | ICD-10-CM

## 2022-01-02 DIAGNOSIS — F411 Generalized anxiety disorder: Secondary | ICD-10-CM

## 2022-01-02 MED ORDER — PROPRANOLOL HCL ER 120 MG PO CP24: ORAL_CAPSULE | ORAL | 1 refills | Status: DC

## 2022-02-19 ENCOUNTER — Other Ambulatory Visit: Payer: Self-pay | Admitting: Cardiology

## 2022-02-19 DIAGNOSIS — I10 Essential (primary) hypertension: Secondary | ICD-10-CM

## 2022-02-28 ENCOUNTER — Encounter: Payer: Self-pay | Admitting: Cardiology

## 2022-02-28 DIAGNOSIS — I480 Paroxysmal atrial fibrillation: Secondary | ICD-10-CM

## 2022-02-28 DIAGNOSIS — F411 Generalized anxiety disorder: Secondary | ICD-10-CM

## 2022-02-28 MED ORDER — MULTAQ 400 MG PO TABS: 400.0000 mg | ORAL_TABLET | Freq: Two times a day (BID) | ORAL | 3 refills | Status: DC

## 2022-02-28 MED ORDER — PROPRANOLOL HCL ER 120 MG PO CP24: ORAL_CAPSULE | ORAL | 3 refills | Status: DC

## 2022-02-28 NOTE — Telephone Encounter (Signed)
ICD-10-CM   1. Paroxysmal atrial fibrillation (HCC)  I48.0 dronedarone (MULTAQ) 400 MG tablet    propranolol ER (INDERAL LA) 120 MG 24 hr capsule    2. Generalized anxiety disorder  F41.1 propranolol ER (INDERAL LA) 120 MG 24 hr capsule     No orders of the defined types were placed in this encounter.   Meds ordered this encounter  Medications   dronedarone (MULTAQ) 400 MG tablet    Sig: Take 1 tablet (400 mg total) by mouth 2 (two) times daily with a meal.    Dispense:  180 tablet    Refill:  3   propranolol ER (INDERAL LA) 120 MG 24 hr capsule    Sig: 1 tablet daily    Dispense:  90 capsule    Refill:  3

## 2022-02-28 NOTE — Telephone Encounter (Signed)
Needs a refill

## 2022-03-01 ENCOUNTER — Ambulatory Visit: Payer: BC Managed Care – PPO | Admitting: Obstetrics & Gynecology

## 2022-03-01 ENCOUNTER — Encounter: Payer: Self-pay | Admitting: Cardiology

## 2022-03-01 NOTE — Telephone Encounter (Signed)
From patient.

## 2022-03-01 NOTE — Telephone Encounter (Signed)
From patient

## 2022-03-02 NOTE — Telephone Encounter (Signed)
From patient.

## 2022-03-02 NOTE — Telephone Encounter (Signed)
From patient

## 2022-03-03 ENCOUNTER — Encounter: Payer: Self-pay | Admitting: Cardiology

## 2022-03-03 ENCOUNTER — Ambulatory Visit: Payer: BC Managed Care – PPO | Admitting: Cardiology

## 2022-03-03 VITALS — BP 124/79 | HR 73 | Resp 16 | Ht 61.0 in | Wt 131.6 lb

## 2022-03-03 DIAGNOSIS — I10 Essential (primary) hypertension: Secondary | ICD-10-CM

## 2022-03-03 DIAGNOSIS — R748 Abnormal levels of other serum enzymes: Secondary | ICD-10-CM

## 2022-03-03 DIAGNOSIS — E7800 Pure hypercholesterolemia, unspecified: Secondary | ICD-10-CM

## 2022-03-03 DIAGNOSIS — F1010 Alcohol abuse, uncomplicated: Secondary | ICD-10-CM

## 2022-03-03 DIAGNOSIS — K760 Fatty (change of) liver, not elsewhere classified: Secondary | ICD-10-CM

## 2022-03-03 DIAGNOSIS — N170 Acute kidney failure with tubular necrosis: Secondary | ICD-10-CM

## 2022-03-03 NOTE — Progress Notes (Signed)
Primary Physician:  Nelly Laurence, NP  Patient ID: Gwendolyn Thompson, female    DOB: December 29, 1960, 62 y.o.   MRN: [id number]  Subjective:    Chief Complaint  Patient presents with   Hypertension   Results    Labs   Follow-up    HPI: Gwendolyn Thompson  is a 62 y.o. female  with hypertension, hyperlipidemia, history of basal cell carcinoma, with brief episodes of A fib and atrial tachycardia on 4 day monitor performed by PCP on 07/31/2018. ) On 02/24/2020 and patient was back in atrial fibrillation although asymptomatic. She underwent unsuccessful Direct-current cardioversion on 04/13/2020 for atrial fibrillation with 3 shocks.  She had 1 more episode of atrial fibrillation and spontaneously converted to sinus rhythm on Multaq.  Patient admits likely 2 days ago that recently she has had markedly abnormal labs including abnormal renal function, abnormal liver enzymes.  I brought her in on urgent basis for evaluation.  Patient states that she feels well and essentially is asymptomatic but she has markedly abnormal labs and she is concerned about this.  I will further discussed with her regarding her lifestyle, she admits to drinking excess amount of alcohol due to personal stress recently.  Past Medical History:  Diagnosis Date   Anxious depression    Atrial fibrillation ([place]) 09/17/2018   B12 deficiency-borderline 09/17/2013   Level was 210, July 2015   Cancer [facility])    basal cell removed from nose   Cervical dysplasia    Depression    Esophageal reflux    HTN (hypertension)    Hyperlipidemia    Hyperplastic colon polyp    Palpitations     Past Surgical History:  Procedure Laterality Date   CARDIOVERSION N/A 04/13/2020   Procedure: CARDIOVERSION;  Surgeon: Nigel Mormon, MD;  Location: [facility] ENDOSCOPY;  Service: Cardiovascular;  Laterality: N/A;   COLONOSCOPY W/ BIOPSIES  08-26-2012   Dr. Glennon Hamilton    COLPOSCOPY     GYNECOLOGIC CRYOSURGERY  1987   MOUTH SURGERY     Social History    Tobacco Use   Smoking status: Former    Packs/day: 0.25    Years: 3.00    Total pack years: 0.75    Types: Cigarettes    Quit date: 08/20/1988    Years since quitting: 33.5   Smokeless tobacco: Never  Substance Use Topics   Alcohol use: Yes    Comment: 4-6 DRINKS A WEEK- WINE   Marital Status: Married   Review of Systems  Cardiovascular:  Negative for chest pain, dyspnea on exertion and leg swelling.   Objective:  Blood pressure 124/79, pulse 73, resp. rate 16, height '5\' 1"'$  (1.549 m), weight 131 lb 9.6 oz (59.7 kg), SpO2 97 %. Body mass index is 24.87 kg/m.      03/03/2022    3:05 PM 12/20/2021    1:45 PM 02/25/2021    2:10 PM  Vitals with BMI  Height '5\' 1"'$  '5\' 1"'$  '5\' 1"'$   Weight 131 lbs 10 oz 138 lbs 6 oz 142 lbs  BMI 24.88 88.50 27.74  Systolic 128 786 767  Diastolic 79 83 84  Pulse 73 85        Physical Exam Neck:     Vascular: No carotid bruit or JVD.  Cardiovascular:     Rate and Rhythm: Normal rate and regular rhythm.     Pulses: Intact distal pulses.     Heart sounds: Normal heart sounds. No murmur heard.    No  gallop.  Pulmonary:     Effort: Pulmonary effort is normal.     Breath sounds: Normal breath sounds.  Abdominal:     General: Bowel sounds are normal.     Palpations: Abdomen is soft. There is hepatomegaly (2" below RCM).  Musculoskeletal:     Right lower leg: No edema.     Left lower leg: No edema.    Radiology: No results found.  Laboratory examination:   External labs:  Labs 10/03/2021 labs 02/24/2021:  Sodium 131, potassium 6.0, BUN 10, creatinine 1.49, EGFR 40 mL, AST 174, ALT 104.  Total bilirubin 0.9.  Hb 10.5/HCT platelets 312.  Normal indicis.  Iron studies suggest severe iron deficiency anemia.  Labs 03/01/2022:  Sodium 132, potassium 5.8, BUN 9, creatinine 1.45, EGFR 41 mL.  AST 236, ALT 127.  Labs 10/03/2021:  Sodium 128, potassium 4.7, BUN 6, creatinine 0.64, EGFR >60 mL, serum glucose 94 mg.  Hb 10.5/HCT 30.5, platelets  389.  Total cholesterol 182, triglycerides 191, HDL 66, LDL 86.  TSH 0.95.  Labs 10/28/2020:  A1c 5.7%.  Total cholesterol 194, triglycerides 140, HDL 82, LDL 84.  Sodium 132, potassium 5.1, chloride 93, BUN 7, creatinine 0.75, EGFR greater than 60 mL.  AST mildly elevated at 52.   Allergies  Allergen Reactions   Penicillins Rash    Current Outpatient Medications:    ALPRAZolam (XANAX) 0.5 MG tablet, Take 0.25 mg by mouth daily as needed for anxiety., Disp: , Rfl:    AMBULATORY NON FORMULARY MEDICATION, Take 4 tablets by mouth daily. ALJ Herbal Respiratory  at breakfast, Disp: , Rfl:    busPIRone (BUSPAR) 10 MG tablet, Take 10 mg by mouth 3 (three) times daily., Disp: , Rfl:    Calcium Carbonate-Vitamin D3 600-400 MG-UNIT TABS, Take 1 tablet by mouth daily., Disp: , Rfl:    Cholecalciferol (VITAMIN D3) 50 MCG (2000 UT) TABS, Take 2,000 Units by mouth daily., Disp: , Rfl:    Coenzyme Q10 (CO Q 10 PO), Take 300 mg by mouth daily., Disp: , Rfl:    diltiazem (CARDIZEM CD) 360 MG 24 hr capsule, TAKE 1 CAPSULE BY MOUTH EVERY DAY, Disp: 90 capsule, Rfl: 3   fexofenadine (ALLEGRA) 180 MG tablet, Take 180 mg by mouth daily., Disp: , Rfl:    fluticasone (FLONASE) 50 MCG/ACT nasal spray, Place 1 spray into both nostrils daily., Disp: , Rfl:    OVER THE COUNTER MEDICATION, Take 2 capsules by mouth daily. Liver detox, Disp: , Rfl:    pantoprazole (PROTONIX) 40 MG tablet, Take 40 mg by mouth daily., Disp: , Rfl:    propranolol ER (INDERAL LA) 120 MG 24 hr capsule, 1 tablet daily, Disp: 90 capsule, Rfl: 3   Propylene Glycol (SYSTANE COMPLETE OP), Place 1 drop into both eyes daily as needed (Dry eyes)., Disp: , Rfl:    TURMERIC CURCUMIN PO, Take 1,000 mg by mouth daily. 500 mg each, Disp: , Rfl:    venlafaxine (EFFEXOR) 75 MG tablet, Take 75 mg by mouth daily. , Disp: , Rfl:      Radiology   Ultrasound of the abdomen 03/02/2022: 1.  Diffusely increased hepatic parenchymal echogenicity most  commonly secondary to hepatic steatosis.  2.  Otherwise, unremarkable ultrasound abdomen.  3. Vascular: The inferior vena cava is patent. The visualized aorta is unremarkable 4. Kidneys: The right kidney measures 9.9 cm in length. The left kidney measures 10.5 cm in length. Normal contour and echogenicity. No hydronephrosis or perinephric fluid. No focal  mass is identified. Tiny left upper pole cyst.  Cardiac Studies:   Lexiscan Sestamibi Stress Test 09/11/2018: Resting EKG normal sinus rhythm, LVH, occasional PVCs.  Stress EKG nondiagnostic due to pharmacologic stress testing.  Hypertensive throughout the study with resting blood pressure 160/110 mmHg. Myocardial perfusion imaging is normal. Left ventricular ejection fraction is  74% with normal wall motion. Low risk study.  4 day Ziopatch 07/31/2018: Dominant rhythm, Normal sinus rhythm. Episodes of brief atrial fibrillation with longest episode being on 06/17 at 1255 for 2 m 56s 112-182 bpm. Some episodes reported as A fib are questionable for Atrial tachycardia (06/19 and 06/18). A fib burden <1%. Patient triggered events for fluttering/racing correlated with A fib. 31 reported episodes of atrial tachycardia with fastest HR 204 bpm on 06/17 at 1437. Avg HR 147 bpm. Longest episode for 8 m on 06/16 at 1505. Rare PAC and PVC with 1 episode ventricular bigeminy for 3.4 s on 06/19.   Echocardiogram 02/26/2020: 1. Normal LV systolic function with EF 68%. Left ventricle cavity is normal in size. Normal global wall motion. Unable to evaluate diastolic function due to atrial fibrillation. Calculated EF 68%. 2. Left atrial cavity is severely dilated at 4.8 cm. 3. Structurally normal mitral valve. Mild (Grade I) mitral regurgitation. 4. Compared to 08/21/2011, atrial fibrillation is new.   Renal duplex 08/20/11: No renal artery stenosis. Normal kidney size bilateral.  Renal artery duplex  04/21/2020:  No evidence of renal artery occlusive disease in  either renal artery.   Normal intrarenal vascular perfusion is noted in the right kidney.   Renal length is within normal limits for both kidneys.  No plaque observed in the abdominal aorta. Normal abdominal aorta flow velocities noted.     EKG:   EKG 12/10/2020: Normal sinus rhythm at rate of 73 bpm, left atrial enlargement, normal axis.  Incomplete right bundle branch block.  Nonspecific T abnormality.  Probably normal.  No significant change from 06/10/2020.   EKG 04/29/2020: Atrial fibrillation with controlled ventricular response at rate of 87 bpm, LVH with nonspecific ST-T abnormality, cannot exclude lateral ischemia. No significant change from EKG 04/01/2020: Atrial fibrillation with controlled ventricular response at the rate of 92 beats minute, normal axis.  LVH with repolarization abnormality, cannot exclude lateral ischemia.     Assessment:     ICD-10-CM   1. Acute renal failure with tubular necrosis (HCC)  N17.0 CMP14+EGFR    2. Essential hypertension  I10     3. Hypercholesteremia  E78.00     4. Hepatic steatosis  K76.0 CMP14+EGFR    5. Excessive drinking alcohol  F10.10     6. Abnormal liver enzymes  R74.8      CHA2DS2-VASc Score is 2.  Yearly risk of stroke: 2.3% (F, HTN).  Score of 1=0.6; 2=2.2; 3=3.2; 4=4.8; 5=7.2; 6=9.8; 7=>9.8) -(CHF; HTN; vasc disease DM,  Female = 1; Age <65 =0; 65-74 = 1,  >75 =2; stroke/embolism= 2).   No orders of the defined types were placed in this encounter.   Medications Discontinued During This Encounter  Medication Reason   olmesartan (BENICAR) 40 MG tablet    dronedarone (MULTAQ) 400 MG tablet Discontinued by provider   atorvastatin (LIPITOR) 80 MG tablet Discontinued by provider   ezetimibe (ZETIA) 10 MG tablet Discontinued by provider   AMBULATORY NON FORMULARY MEDICATION Discontinued by provider   atorvastatin (LIPITOR) 80 MG tablet Discontinued by provider     Recommendations:   Sinai Illingworth  is a 62 y.o.  with  hypertension, hyperlipidemia, history of basal cell carcinoma, with brief episodes of A fib and atrial tachycardia on 4 day monitor performed by PCP on 07/31/2018. ) On 02/24/2020 and patient was back in atrial fibrillation although asymptomatic. She underwent unsuccessful Direct-current cardioversion on 04/13/2020 for atrial fibrillation with 3 shocks.  She had 1 more episode of atrial fibrillation and spontaneously converted to sinus rhythm on Multaq.  Patient admits likely 2 days ago that recently she has had markedly abnormal labs including abnormal renal function, abnormal liver enzymes.  I brought her in on urgent basis for evaluation.  1. Acute renal failure with tubular necrosis ([place]) Patient in ATN, has been having dark urine and urine analysis has been performed I do not have access to the labs yet but should be available to me in Care Everywhere so.  I will follow-up on this.  For now discontinue Benicar, blood pressure is well-controlled.  2. Essential hypertension As dictated above blood pressure is well-controlled, presently on diltiazem and also on propranolol due to resting sinus tachycardia and also for anxiety she is on a beta-blocker as well.  Will discontinue benazepril in view of acute renal failure.  Suspect ATN.  Urine analysis will certainly help [place] in deciphering etiology.  Ultrasound of the abdomen reviewed, renal parenchyma appears to be normal in size and structure and no hydronephrosis.  She has previously has had renal duplex x 2 and there was no evidence of renal artery stenosis.  3. Hypercholesteremia Although she has hypercholesterolemia, she is on 80 mg of Lipitor and also on Zetia and in view of abnormal liver enzymes, I discontinued this.  I believe that her LFTs are abnormal due to alcoholic hepatitis and steatohepatitis due to poor eating habits.  Strict guidance given regarding alcohol intake, she has been drinking almost a bottle of wine every day.  For now I will  discontinue Lipitor and Zetia.  4. Hepatic steatosis In view of abnormal liver enzymes, Multaq has been discontinued.  She has been maintaining sinus rhythm, presently not on anticoagulation as per her preference and also low chads vascular score.  Hopefully with holding hepatotoxic agents, LFTs will improve, I would like to repeat CMP in 3-4 days from now, patient is flying to [place] for 6 weeks and we will repeat CMP at that point again to follow-up on renal function and also hepatic function.  Strict guidelines regarding the diet also discussed with the patient.  Advised her to avoid excess amount of fruits and fruit juices due to hyperkalemia.  5. Excessive drinking alcohol Patient has been drinking almost a bottle of wine a day since some tragedies in the house including loss of her mother and stepmother and personal issues.  She has already cut down on alcohol since finding out her LFTs have been abnormal.  Advised her complete abstinence.  6. Abnormal liver enzymes I suspect with above recommendations and holding nephro toxic agents and also hepatotoxic agents both renal function could improve and hepatic function will certainly improve.  I have discussed with her regarding also avoidance of high fatty meal.  I also discontinued all supplements that she has been taking as well.  I would like to see her back after she returns from her vacation in 6 weeks.  She has appointment already scheduled for GI workup, hopefully in the next 4 days with a repeat CMP I will see trend towards decreasing LFTs.  This was a 60-minute office visit encounter.    Ulice Dash  Einar Gip, [place], [facility] 03/03/2022, 10:44 PM Office: [phone] Pager: [phone]

## 2022-03-06 ENCOUNTER — Encounter: Payer: Self-pay | Admitting: Cardiology

## 2022-03-09 ENCOUNTER — Other Ambulatory Visit: Payer: Self-pay | Admitting: Cardiology

## 2022-04-07 ENCOUNTER — Encounter: Payer: Self-pay | Admitting: Cardiology

## 2022-04-07 NOTE — Telephone Encounter (Signed)
From paitent

## 2022-04-10 NOTE — Telephone Encounter (Signed)
From patient

## 2022-05-01 ENCOUNTER — Other Ambulatory Visit: Payer: Self-pay | Admitting: Obstetrics & Gynecology

## 2022-05-01 DIAGNOSIS — Z1231 Encounter for screening mammogram for malignant neoplasm of breast: Secondary | ICD-10-CM

## 2022-05-02 ENCOUNTER — Ambulatory Visit: Payer: BC Managed Care – PPO | Admitting: Cardiology

## 2022-05-04 ENCOUNTER — Ambulatory Visit: Payer: BC Managed Care – PPO | Admitting: Cardiology

## 2022-05-04 ENCOUNTER — Encounter: Payer: Self-pay | Admitting: Cardiology

## 2022-05-04 VITALS — BP 124/76 | HR 86 | Resp 16 | Ht 61.0 in | Wt 132.0 lb

## 2022-05-04 DIAGNOSIS — Z8679 Personal history of other diseases of the circulatory system: Secondary | ICD-10-CM

## 2022-05-04 DIAGNOSIS — R748 Abnormal levels of other serum enzymes: Secondary | ICD-10-CM

## 2022-05-04 DIAGNOSIS — K760 Fatty (change of) liver, not elsewhere classified: Secondary | ICD-10-CM

## 2022-05-04 DIAGNOSIS — I10 Essential (primary) hypertension: Secondary | ICD-10-CM

## 2022-05-04 MED ORDER — LISINOPRIL 20 MG PO TABS: 20.0000 mg | ORAL_TABLET | Freq: Every evening | ORAL | 2 refills | Status: DC

## 2022-05-04 NOTE — Progress Notes (Signed)
Primary Physician:  Nelly Laurence, NP  Patient ID: Gwendolyn Thompson, female    DOB: [birth year], 61 y.o.   MRN: [id number]  Subjective:    Chief Complaint  Patient presents with   Atrial Fibrillation   Hyperlipidemia   Hypertension   Acute renal failure with tubular necrosis   Follow-up    2 month    HPI: Gwendolyn Thompson  is a 62 y.o. female  wwith hypertension, hyperlipidemia, history of basal cell carcinoma, with brief episodes of A fib and atrial tachycardia on 4 day monitor performed by PCP on 07/31/2018, was back in sustained A. Fib on 02/24/2020, underwent unsuccessful Direct-current cardioversion on 04/13/2020.  She had 1 more episode of atrial fibrillation and spontaneously converted to sinus rhythm on Multaq.  She has not had any recurrence of atrial fibrillation since middle of 2022 and Multaq and anticoagulation was discontinued per patient wishes.  Patient presents for follow-up of abnormal liver enzymes, hypertension and history of atrial fibrillation.  She has reduced her alcohol drinking drastically and now drinking only 2 glasses of white wine a day.  Past Medical History:  Diagnosis Date   Anxious depression    Atrial fibrillation ([place]) 09/17/2018   B12 deficiency-borderline 09/17/2013   Level was 210, July 2015   Cancer [facility])    basal cell removed from nose   Cervical dysplasia    Depression    Esophageal reflux    HTN (hypertension)    Hyperlipidemia    Hyperplastic colon polyp    Palpitations     Past Surgical History:  Procedure Laterality Date   CARDIOVERSION N/A 04/13/2020   Procedure: CARDIOVERSION;  Surgeon: Nigel Mormon, MD;  Location: [facility];  Service: Cardiovascular;  Laterality: N/A;   COLONOSCOPY W/ BIOPSIES  08-26-2012   Dr. Glennon Hamilton    COLPOSCOPY     GYNECOLOGIC CRYOSURGERY  1987   MOUTH SURGERY     Social History   Tobacco Use   Smoking status: Former    Packs/day: 0.25    Years: 3.00    Additional pack years: 0.00     Total pack years: 0.75    Types: Cigarettes    Quit date: 08/20/1988    Years since quitting: 33.7   Smokeless tobacco: Never  Substance Use Topics   Alcohol use: Yes    Comment: 4-6 DRINKS A WEEK- WINE   Marital Status: Married   Review of Systems  Cardiovascular:  Negative for chest pain, dyspnea on exertion and leg swelling.   Objective:  Blood pressure 124/76, pulse 86, resp. rate 16, height 5\' 1"  (1.549 m), weight 132 lb (59.9 kg), SpO2 97 %. Body mass index is 24.94 kg/m.      05/04/2022    3:44 PM 05/04/2022    3:34 PM 03/03/2022    3:05 PM  Vitals with BMI  Height  5\' 1"  5\' 1"   Weight  132 lbs 131 lbs 10 oz  BMI  [id number]  Systolic [id number] A999333  Diastolic 76 79 79  Pulse 86 78 73       Physical Exam Neck:     Vascular: No carotid bruit or JVD.  Cardiovascular:     Rate and Rhythm: Normal rate and regular rhythm.     Pulses: Intact distal pulses.     Heart sounds: Normal heart sounds. No murmur heard.    No gallop.  Pulmonary:     Effort: Pulmonary effort is normal.     Breath sounds:  Normal breath sounds.  Abdominal:     General: Bowel sounds are normal.     Palpations: Abdomen is soft. There is hepatomegaly (just felt below left costal margin).  Musculoskeletal:     Right lower leg: No edema.     Left lower leg: No edema.    Radiology: No results found.  Laboratory examination:   External labs:  Labs 05/02/2022:  Urinary creatinine 14.9, urinary protein 26.0, urinary protein/creatinine ratio 1145.  (0-200 mg/Gm Creatinine (.  Serum glucose 92 mg, BUN 8, creatinine 0.89, EGFR 74 mL.  Potassium 4.3.  Total bilirubin 0.5, alkaline phosphatase 200, AST 102, ALT 57.  Labs 03/01/2022:   Sodium 132, potassium 5.8, BUN 9, creatinine 1.45, EGFR 41 mL.  Alkaline phosphatase 186, AST 236, ALT 127.  Labs 10/03/2021 labs 02/24/2021:  Sodium 131, potassium 6.0, BUN 10, creatinine 1.49, EGFR 40 mL, AST 174, ALT 104.  Total bilirubin 0.9.  Hb 10.5/HCT  platelets 312.  Normal indicis.  Iron studies suggest severe iron deficiency anemia. Allergies  Allergen Reactions   Penicillins Rash    Current Outpatient Medications:    ALPRAZolam (XANAX) 0.5 MG tablet, Take 0.25 mg by mouth daily as needed for anxiety., Disp: , Rfl:    busPIRone (BUSPAR) 10 MG tablet, Take 10 mg by mouth 3 (three) times daily., Disp: , Rfl:    Calcium Carbonate-Vitamin D3 600-400 MG-UNIT TABS, Take 1 tablet by mouth daily., Disp: , Rfl:    cetirizine (ZYRTEC) 10 MG chewable tablet, Chew 10 mg by mouth daily., Disp: , Rfl:    diltiazem (CARDIZEM CD) 360 MG 24 hr capsule, TAKE 1 CAPSULE BY MOUTH EVERY DAY, Disp: 90 capsule, Rfl: 3   fluticasone (FLONASE) 50 MCG/ACT nasal spray, Place 1 spray into both nostrils daily., Disp: , Rfl:    lisinopril (ZESTRIL) 20 MG tablet, Take 1 tablet (20 mg total) by mouth every evening., Disp: 30 tablet, Rfl: 2   pantoprazole (PROTONIX) 40 MG tablet, Take 40 mg by mouth daily., Disp: , Rfl:    propranolol ER (INDERAL LA) 120 MG 24 hr capsule, 1 tablet daily, Disp: 90 capsule, Rfl: 3   Propylene Glycol (SYSTANE COMPLETE OP), Place 1 drop into both eyes daily as needed (Dry eyes)., Disp: , Rfl:    venlafaxine (EFFEXOR) 75 MG tablet, Take 75 mg by mouth daily. , Disp: , Rfl:    AMBULATORY NON FORMULARY MEDICATION, Take 4 tablets by mouth daily. ALJ Herbal Respiratory  at breakfast, Disp: , Rfl:      Radiology   Ultrasound of the abdomen 03/02/2022: 1.  Diffusely increased hepatic parenchymal echogenicity most commonly secondary to hepatic steatosis.  2.  Otherwise, unremarkable ultrasound abdomen.  3. Vascular: The inferior vena cava is patent. The visualized aorta is unremarkable 4. Kidneys: The right kidney measures 9.9 cm in length. The left kidney measures 10.5 cm in length. Normal contour and echogenicity. No hydronephrosis or perinephric fluid. No focal mass is identified. Tiny left upper pole cyst.  Cardiac Studies:   Lexiscan  Sestamibi Stress Test 09/11/2018: Resting EKG normal sinus rhythm, LVH, occasional PVCs.  Stress EKG nondiagnostic due to pharmacologic stress testing.  Hypertensive throughout the study with resting blood pressure 160/110 mmHg. Myocardial perfusion imaging is normal. Left ventricular ejection fraction is  74% with normal wall motion. Low risk study.  4 day Ziopatch 07/31/2018: Dominant rhythm, Normal sinus rhythm. Episodes of brief atrial fibrillation with longest episode being on 06/17 at 1255 for 2 m 56s 112-182 bpm. Some  episodes reported as A fib are questionable for Atrial tachycardia (06/19 and 06/18). A fib burden <1%. Patient triggered events for fluttering/racing correlated with A fib. 31 reported episodes of atrial tachycardia with fastest HR 204 bpm on 06/17 at 1437. Avg HR 147 bpm. Longest episode for 8 m on 06/16 at 1505. Rare PAC and PVC with 1 episode ventricular bigeminy for 3.4 s on 06/19.   Echocardiogram 02/26/2020: 1. Normal LV systolic function with EF 68%. Left ventricle cavity is normal in size. Normal global wall motion. Unable to evaluate diastolic function due to atrial fibrillation. Calculated EF 68%. 2. Left atrial cavity is severely dilated at 4.8 cm. 3. Structurally normal mitral valve. Mild (Grade I) mitral regurgitation. 4. Compared to 08/21/2011, atrial fibrillation is new.   Renal duplex 08/20/11: No renal artery stenosis. Normal kidney size bilateral.  Renal artery duplex  04/21/2020:  No evidence of renal artery occlusive disease in either renal artery.   Normal intrarenal vascular perfusion is noted in the right kidney.   Renal length is within normal limits for both kidneys.  No plaque observed in the abdominal aorta. Normal abdominal aorta flow velocities noted.     EKG:   EKG 05/04/2022: Normal sinus rhythm at the rate of 87 bpm, normal axis, T wave abnormality, cannot exclude anterolateral ischemia.  Compared to 12/20/2021, T wave inversion slightly more  prominent.  EKG 04/29/2020: Atrial fibrillation with controlled ventricular response at rate of 87 bpm, LVH with nonspecific ST-T abnormality, cannot exclude lateral ischemia. No significant change from EKG 04/01/2020: Atrial fibrillation with controlled ventricular response at the rate of 92 beats minute, normal axis.  LVH with repolarization abnormality, cannot exclude lateral ischemia.     Assessment:     ICD-10-CM   1. History of atrial fibrillation  Z86.79 EKG 12-Lead    2. Essential hypertension  I10 lisinopril (ZESTRIL) 20 MG tablet    Basic metabolic panel    3. Hepatic steatosis  K76.0     4. Abnormal liver enzymes  R74.8      CHA2DS2-VASc Score is 2.  Yearly risk of stroke: 2.3% (F, HTN).  Score of 1=0.6; 2=2.2; 3=3.2; 4=4.8; 5=7.2; 6=9.8; 7=>9.8) -(CHF; HTN; vasc disease DM,  Female = 1; Age <65 =0; 65-74 = 1,  >75 =2; stroke/embolism= 2).   Meds ordered this encounter  Medications   lisinopril (ZESTRIL) 20 MG tablet    Sig: Take 1 tablet (20 mg total) by mouth every evening.    Dispense:  30 tablet    Refill:  2    Medications Discontinued During This Encounter  Medication Reason   Cholecalciferol (VITAMIN D3) 50 MCG (2000 UT) TABS    Coenzyme Q10 (CO Q 10 PO)    fexofenadine (ALLEGRA) 180 MG tablet    OVER THE COUNTER MEDICATION    TURMERIC CURCUMIN PO      Recommendations:   Gwendolyn Thompson  is a 62 y.o. with hypertension, hyperlipidemia, history of basal cell carcinoma, with brief episodes of A fib and atrial tachycardia on 4 day monitor performed by PCP on 07/31/2018, was back in sustained A. Fib on 02/24/2020, underwent unsuccessful Direct-current cardioversion on 04/13/2020.  She had 1 more episode of atrial fibrillation and spontaneously converted to sinus rhythm on Multaq.  She has not had any recurrence of atrial fibrillation since middle of 2022 and Multaq and anticoagulation was discontinued per patient wishes.  On further evaluation, patient found to  have history of excessive alcohol use.  I  am seeing her here for a 6 to 8-week follow-up after recent evaluation for acute renal failure, abnormal liver enzymes and hypertension.    1. Essential hypertension Blood pressure is now well-controlled.  She has drastically reduced alcohol drinking to 2 glasses of wine a day.  No changes in the medications were done.  2. Hepatic steatosis Reviewed external labs, after extensive evaluation in the past, her resistant hypertension, atrial fibrillation, abnormal liver enzymes and now proteinuria all suggest alcohol to be the etiology.  3. Abnormal liver enzymes Again discussed regarding abnormal liver enzymes, fortunately they have come down but not drastically as expected as it has been 6 weeks ago that she had initial LFTs.  Advised her that she completely needs to avoid alcohol.  I also advised her that she needs to seek counseling to help her quit drinking alcohol.  This was a 40-minute office visit encounter at the end of the day visit and reviewing all her charts, reviewing her labs and investigations.  She has been recommended liver biopsy as there is suggestion of early cirrhosis, advised her that as long as she remains abstinent from alcohol and liver enzymes normalized, it is left to her whether she wants to go through biopsy or she would like to continue to monitor this by ultrasound serially.  She understands the gravity of situation and need for abstinence from alcohol completely.  4. History of atrial fibrillation She has not had any recurrence of atrial fibrillation, as per mutual decision, anticoagulation has been discontinued, she is also not on any antiarrhythmic drugs.  Fortunately maintaining sinus rhythm.      Adrian Prows, MD, [facility] 05/04/2022, 9:44 PM Office: [phone] Pager: [phone]

## 2022-05-18 ENCOUNTER — Other Ambulatory Visit: Payer: Self-pay | Admitting: Cardiology

## 2022-05-18 DIAGNOSIS — E7800 Pure hypercholesterolemia, unspecified: Secondary | ICD-10-CM

## 2022-05-18 DIAGNOSIS — I10 Essential (primary) hypertension: Secondary | ICD-10-CM

## 2022-06-14 ENCOUNTER — Encounter: Payer: Self-pay | Admitting: Cardiology

## 2022-06-14 ENCOUNTER — Ambulatory Visit
Admission: RE | Admit: 2022-06-14 | Discharge: 2022-06-14 | Disposition: A | Discharge status: Home / Self Care | Payer: BC Managed Care – PPO | Source: Ambulatory Visit | Attending: Obstetrics & Gynecology | Admitting: Obstetrics & Gynecology

## 2022-06-14 DIAGNOSIS — Z1231 Encounter for screening mammogram for malignant neoplasm of breast: Secondary | ICD-10-CM

## 2022-06-14 NOTE — Telephone Encounter (Signed)
From patient.

## 2022-06-16 ENCOUNTER — Other Ambulatory Visit: Payer: Self-pay | Admitting: Obstetrics & Gynecology

## 2022-06-16 DIAGNOSIS — R928 Other abnormal and inconclusive findings on diagnostic imaging of breast: Secondary | ICD-10-CM

## 2022-06-24 ENCOUNTER — Ambulatory Visit: Payer: BC Managed Care – PPO

## 2022-06-24 ENCOUNTER — Ambulatory Visit
Admission: RE | Admit: 2022-06-24 | Discharge: 2022-06-24 | Disposition: A | Discharge status: Home / Self Care | Payer: BC Managed Care – PPO | Source: Ambulatory Visit | Attending: Obstetrics & Gynecology | Admitting: Obstetrics & Gynecology

## 2022-06-24 DIAGNOSIS — R928 Other abnormal and inconclusive findings on diagnostic imaging of breast: Secondary | ICD-10-CM

## 2022-07-28 ENCOUNTER — Ambulatory Visit (INDEPENDENT_AMBULATORY_CARE_PROVIDER_SITE_OTHER): Payer: BC Managed Care – PPO | Admitting: Obstetrics & Gynecology

## 2022-07-28 ENCOUNTER — Encounter: Payer: Self-pay | Admitting: Obstetrics & Gynecology

## 2022-07-28 VITALS — BP 118/68 | HR 76 | Resp 20 | Ht 60.43 in | Wt 131.8 lb

## 2022-07-28 DIAGNOSIS — Z01419 Encounter for gynecological examination (general) (routine) without abnormal findings: Secondary | ICD-10-CM

## 2022-07-28 DIAGNOSIS — Z780 Asymptomatic menopausal state: Secondary | ICD-10-CM | POA: Diagnosis not present

## 2022-07-28 NOTE — Progress Notes (Signed)
Gwendolyn Thompson 04/28/1960 [id number]   History:    62 y.o. G0 Same sex wife, Tamela Oddi.   RP:  Established patient presenting for annual gyn exam    HPI: Postmenopause, well on no hormone replacement therapy.  No postmenopausal bleeding.  No pelvic pain.  Pap negative 02/2020. No H/O Abnormal Pap.  Repeat Pap at 3 years. Urine and bowel movements normal. Breasts normal.  Screening Mammo Rt Neg, Lt Dx Mammo/US Negative 06/2022. Body mass index 25.37. Walking daily and playing pickle ball.  Health labs with family physician.  Diagnosed with A. fib in 2020, followed by Cardio.  Colonoscopy July 2014, polyp removed, repeat in 2024.  BD normal 04/2017.   Past medical history,surgical history, family history and social history were all reviewed and documented in the EPIC chart.  Gynecologic History No LMP recorded. Patient is postmenopausal.  Obstetric History OB History  Gravida Para Term Preterm AB Living  0            SAB IAB Ectopic Multiple Live Births                ROS: A ROS was performed and pertinent positives and negatives are included in the history. GENERAL: No fevers or chills. HEENT: No change in vision, no earache, sore throat or sinus congestion. NECK: No pain or stiffness. CARDIOVASCULAR: No chest pain or pressure. No palpitations. PULMONARY: No shortness of breath, cough or wheeze. GASTROINTESTINAL: No abdominal pain, nausea, vomiting or diarrhea, melena or bright red blood per rectum. GENITOURINARY: No urinary frequency, urgency, hesitancy or dysuria. MUSCULOSKELETAL: No joint or muscle pain, no back pain, no recent trauma. DERMATOLOGIC: No rash, no itching, no lesions. ENDOCRINE: No polyuria, polydipsia, no heat or cold intolerance. No recent change in weight. HEMATOLOGICAL: No anemia or easy bruising or bleeding. NEUROLOGIC: No headache, seizures, numbness, tingling or weakness. PSYCHIATRIC: No depression, no loss of interest in normal activity or change in sleep pattern.      Exam:   BP 118/68 (BP Location: Right Arm, Patient Position: Sitting)   Pulse 76   Resp 20   Ht 5' 0.43" (1.535 m)   Wt 131 lb 12.8 oz (59.8 kg)   SpO2 98%   BMI 25.37 kg/m   Body mass index is 25.37 kg/m.  General appearance : Well developed well nourished female. No acute distress HEENT: Eyes: no retinal hemorrhage or exudates,  Neck supple, trachea midline, no carotid bruits, no thyroidmegaly Lungs: Clear to auscultation, no rhonchi or wheezes, or rib retractions  Heart: Regular rate and rhythm, no murmurs or gallops Breast:Examined in sitting and supine position were symmetrical in appearance, no palpable masses or tenderness,  no skin retraction, no nipple inversion, no nipple discharge, no skin discoloration, no axillary or supraclavicular lymphadenopathy Abdomen: no palpable masses or tenderness, no rebound or guarding Extremities: no edema or skin discoloration or tenderness  Pelvic: Vulva: Normal             Vagina: No gross lesions or discharge  Cervix: No gross lesions or discharge  Uterus  AV, normal size, shape and consistency, non-tender and mobile  Adnexa  Without masses or tenderness  Anus: Normal   Assessment/Plan:  62 y.o. female for annual exam   1. Well female exam with routine gynecological exam Postmenopause, well on no hormone replacement therapy.  No postmenopausal bleeding.  No pelvic pain.  Pap negative 02/2020. No H/O Abnormal Pap.  Repeat Pap at 3 years. Urine and bowel movements normal. Breasts normal.  Screening Mammo Rt Neg, Lt Dx Mammo/US Negative 06/2022. Body mass index 25.37. Walking daily and playing pickle ball.  Health labs with family physician.  Diagnosed with A. fib in 2020, followed by Cardio.  Colonoscopy July 2014, polyp removed, repeat in 2024.  BD normal 04/2017.  2. Postmenopausal Postmenopause, well on no hormone replacement therapy.  No postmenopausal bleeding.  No pelvic pain.   Other orders - dronedarone (MULTAQ) 400 MG  tablet; TAKE 1 TABLET BY MOUTH 2 (TWO) TIMES DAILY WITH A MEAL. START 5 DAYS AFTER STOPPING FLECAINIDE   Genia Del MD, 1:47 PM

## 2022-08-02 ENCOUNTER — Encounter: Payer: Self-pay | Admitting: Cardiology

## 2022-08-02 NOTE — Telephone Encounter (Signed)
From pt

## 2022-10-23 ENCOUNTER — Other Ambulatory Visit: Payer: Self-pay | Admitting: Cardiology

## 2022-12-21 ENCOUNTER — Ambulatory Visit: Payer: Self-pay | Admitting: Cardiology

## 2023-01-02 ENCOUNTER — Encounter: Payer: Self-pay | Admitting: Cardiology

## 2023-01-02 ENCOUNTER — Ambulatory Visit: Payer: BC Managed Care – PPO | Attending: Cardiology | Admitting: Cardiology

## 2023-01-02 VITALS — BP 110/72 | HR 69 | Resp 16 | Ht 65.0 in | Wt 137.6 lb

## 2023-01-02 DIAGNOSIS — I480 Paroxysmal atrial fibrillation: Secondary | ICD-10-CM

## 2023-01-02 DIAGNOSIS — I10 Essential (primary) hypertension: Secondary | ICD-10-CM | POA: Diagnosis not present

## 2023-01-02 DIAGNOSIS — K7010 Alcoholic hepatitis without ascites: Secondary | ICD-10-CM

## 2023-01-02 MED ORDER — APIXABAN 5 MG PO TABS
5.0000 mg | ORAL_TABLET | Freq: Two times a day (BID) | ORAL | 6 refills | Status: DC
Start: 2023-01-02 — End: 2023-04-24

## 2023-01-02 MED ORDER — NALTREXONE HCL 50 MG PO TABS
50.0000 mg | ORAL_TABLET | Freq: Every day | ORAL | 0 refills | Status: DC
Start: 2023-01-02 — End: 2023-04-24

## 2023-01-02 MED ORDER — APIXABAN 5 MG PO TABS: 5.0000 mg | ORAL_TABLET | Freq: Two times a day (BID) | ORAL | 0 refills | Status: DC

## 2023-01-02 NOTE — Patient Instructions (Addendum)
Medication Instructions:  Your physician has recommended you make the following change in your medication: Start Eliquis 5 mg by mouth twice daily   *If you need a refill on your cardiac medications before your next appointment, please call your pharmacy*   Lab Work: Lab work to be done today--CBC, CMET and lipids If you have labs (blood work) drawn today and your tests are completely normal, you will receive your results only by: MyChart Message (if you have MyChart) OR A paper copy in the mail If you have any lab test that is abnormal or we need to change your treatment, we will call you to review the results.   Testing/Procedures: Your physician has requested that you have an echocardiogram. Echocardiography is a painless test that uses sound waves to create images of your heart. It provides your doctor with information about the size and shape of your heart and how well your heart's chambers and valves are working. This procedure takes approximately one hour. There are no restrictions for this procedure. Please do NOT wear cologne, perfume, aftershave, or lotions (deodorant is allowed). Please arrive 15 minutes prior to your appointment time.  Please note: We ask at that you not bring children with you during ultrasound (echo/ vascular) testing. Due to room size and safety concerns, children are not allowed in the ultrasound rooms during exams. Our front office staff cannot provide observation of children in our lobby area while testing is being conducted. An adult accompanying a patient to their appointment will only be allowed in the ultrasound room at the discretion of the ultrasound technician under special circumstances. We apologize for any inconvenience.  Your physician has recommended that you have a Cardioversion (DCCV). Electrical Cardioversion uses a jolt of electricity to your heart either through paddles or wired patches attached to your chest. This is a controlled, usually  prescheduled, procedure. Defibrillation is done under light anesthesia in the hospital, and you usually go home the day of the procedure. This is done to get your heart back into a normal rhythm. You are not awake for the procedure. Please see the instruction sheet given to you today.     Follow-Up: At [facility], you and your health needs are our priority.  As part of our continuing mission to provide you with exceptional heart care, we have created designated Provider Care Teams.  These Care Teams include your primary Cardiologist (physician) and Advanced Practice Providers (APPs -  Physician Assistants and Nurse Practitioners) who all work together to provide you with the care you need, when you need it.  We recommend signing up for the patient portal called "MyChart".  Sign up information is provided on this After Visit Summary.  MyChart is used to connect with patients for Virtual Visits (Telemedicine).  Patients are able to view lab/test results, encounter notes, upcoming appointments, etc.  Non-urgent messages can be sent to your provider as well.   To learn more about what you can do with MyChart, go to [email or web address].    Your next appointment:     6 week(s)  Provider:   Jari Favre, PA-C, Ronie Spies, PA-C, Robin Searing, NP, Jacolyn Reedy, PA-C, Eligha Bridegroom, NP, Tereso Newcomer, PA-C, or Perlie Gold, PA-C      Then, Yates Decamp, MD will plan to see you again in 3 month(s).    Please schedule nurse room visit in 2 weeks for EKG Other Instructions        Dear Gwendolyn Thompson  You  are scheduled for a Cardioversion on Tuesday, December 17 with Dr. Jacques Navy.  Please arrive at the [facility] (Main Entrance A) at [facility]: [address] [place], [place] [address] at 7:00 AM (This time is 1 hour(s) before your procedure to ensure your preparation).   Free valet parking service is available. You will check in at ADMITTING.   *Please Note: You will  receive a call the day before your procedure to confirm the appointment time. That time may have changed from the original time based on the schedule for that day.*    DIET:  Nothing to eat or drink after midnight except a sip of water with medications (see medication instructions below)  MEDICATION INSTRUCTIONS: !!IF ANY NEW MEDICATIONS ARE STARTED AFTER TODAY, PLEASE NOTIFY YOUR PROVIDER AS SOON AS POSSIBLE!!  FYI: Medications such as Semaglutide (Ozempic, [place]), Tirzepatide (Mounjaro, Zepbound), Dulaglutide (Trulicity), etc ("GLP1 agonists") AND Canagliflozin (Invokana), Dapagliflozin (Farxiga), Empagliflozin (Jardiance), Ertugliflozin (Steglatro), Bexagliflozin [employer]) or any combination with one of these drugs such as Invokamet (Canagliflozin/Metformin), Synjardy (Empagliflozin/Metformin), etc ("SGLT2 inhibitors") must be held around the time of a procedure. This is not a comprehensive list of all of these drugs. Please review all of your medications and talk to your provider if you take any one of these. If you are not sure, ask your provider.        LABS:done in office on 11/19 FYI:  For your safety, and to allow [place] to monitor your vital signs accurately during the surgery/procedure we request: If you have artificial nails, gel coating, SNS etc, please have those removed prior to your surgery/procedure. Not having the nail coverings /polish removed may result in cancellation or delay of your surgery/procedure.  Your support person will be asked to wait in the waiting room during your procedure.  It is OK to have someone drop you off and come back when you are ready to be discharged.  You cannot drive after the procedure and will need someone to drive you home.  Bring your insurance cards.  *Special Note: Every effort is made to have your procedure done on time. Occasionally there are emergencies that occur at the hospital that may cause delays. Please be patient if a delay does  occur.

## 2023-01-02 NOTE — Progress Notes (Signed)
Cardiology Office Note:  .   Date:  01/03/2023  ID:  Gwendolyn Thompson, DOB 1960/04/01, MRN [id number] PCP: Loyola Mast, NP  [facility] Providers Cardiologist:  Yates Decamp, MD    History of Present Illness: .   Gwendolyn Thompson is a 62 y.o. Caucasian female  with hypertension, hyperlipidemia, history of basal cell carcinoma, with brief episodes of A fib and atrial tachycardia on 4 day monitor performed by PCP on 07/31/2018, was back in sustained A. Fib on 02/24/2020, underwent unsuccessful Direct-current cardioversion on 04/13/2020.  She had 1 more episode of atrial fibrillation and spontaneously converted to sinus rhythm on Multaq.  She has not had any recurrence of atrial fibrillation since middle of 2022 and Multaq and anticoagulation was discontinued per patient wishes.  She also has history of alcoholic hepatitis.  This is a 28-month office visit.  Discussed the use of AI scribe software for clinical note transcription with the patient, who gave verbal consent to proceed.  History of Present Illness   The patient, with a history of alcoholism, presents with ongoing struggles with alcohol cessation. She reports a pattern of starting and stopping drinking, but overall feels better. She has been attending AA meetings, reading AA literature, and practicing meditation. She also reports improved anxiety, appearing calm during the visit.  The patient has been seeing a psychologist, Dr. Lovette Cliche, but reports that the chemistry was not there. She has concerns about her liver health, worrying daily about whether her liver is too far gone. She expresses a desire to return to her exercise routine, which she has not been able to maintain.  The patient also reports a stressful weekend, which she believes may have contributed to her current state of health. She has a history of atrial fibrillation (AFib), and during the visit, the doctor notes that she is back in AFib.        Review of Systems   Cardiovascular:  Negative for chest pain, dyspnea on exertion and leg swelling.    Labs      Latest Ref Rng & Units 01/02/2023   10:01 AM 04/07/2020   10:40 AM 09/18/2019    8:37 AM  CMP  Glucose 70 - 99 mg/dL 83  324  94   BUN 8 - 27 mg/dL 6  12  9    Creatinine 0.57 - 1.00 mg/dL 4.01  0.27  2.53   Sodium 134 - 144 mmol/L 125  128  131   Potassium 3.5 - 5.2 mmol/L 5.3  4.6  4.5   Chloride 96 - 106 mmol/L 87  88  88   CO2 20 - 29 mmol/L 22  22  25    Calcium 8.7 - 10.3 mg/dL 9.7  9.8  9.4   Total Protein 6.0 - 8.5 g/dL 7.8     Total Bilirubin 0.0 - 1.2 mg/dL 0.5     Alkaline Phos 44 - 121 IU/L 135     AST 0 - 40 IU/L 124     ALT 0 - 32 IU/L 52         Latest Ref Rng & Units 01/02/2023   10:01 AM 04/14/2010   10:30 PM  CBC  WBC 3.4 - 10.8 x10E3/uL 6.3  6.6   Hemoglobin 11.1 - 15.9 g/dL 66.4  40.3   Hematocrit 34.0 - 46.6 % 34.8  37.1   Platelets 150 - 450 x10E3/uL 358  268    Lipid Panel     Component Value Date/Time  CHOL 313 (H) 01/02/2023 1001   TRIG 389 (H) 01/02/2023 1001   HDL 54 01/02/2023 1001   CHOLHDL 5.8 (H) 01/02/2023 1001   LDLCALC 182 (H) 01/02/2023 1001    External Labs:  Labs 10/23/2022:  Urinary protein to creatinine ratio 967.  Hb 13.7/HCT 39.1, platelets 228.  Normal indicis.  Serum glucose 100 mg, BUN 9, creatinine 1.03, EGFR 62 mL, sodium 131, potassium 5.1.  Alkaline phosphatase 136, AST 86, ALT 34.  Labs 05/02/2022:   Urinary creatinine 14.9, urinary protein 26.0, urinary protein/creatinine ratio 1145.  (0-200 mg/Gm Creatinine (.   Serum glucose 92 mg, BUN 8, creatinine 0.89, EGFR 74 mL.  Potassium 4.3.   Total bilirubin 0.5, alkaline phosphatase 200, AST 102, ALT 57.   Labs 03/01/2022:    Sodium 132, potassium 5.8, BUN 9, creatinine 1.45, EGFR 41 mL.  Alkaline phosphatase 186, AST 236, ALT 127.  Labs 09/12/2021:  Total cholesterol 182, triglycerides 191, HDL 66, LDL 86.  Non-HDL cholesterol 116.  Physical Exam:   VS:  BP 110/72 (BP  Location: Left Arm, Patient Position: Sitting, Cuff Size: Normal)   Pulse 69   Resp 16   Ht 5\' 5"  (1.651 m)   Wt 137 lb 9.6 oz (62.4 kg)   SpO2 98%   BMI 22.90 kg/m    Wt Readings from Last 3 Encounters:  01/02/23 137 lb 9.6 oz (62.4 kg)  07/28/22 131 lb 12.8 oz (59.8 kg)  05/04/22 132 lb (59.9 kg)     Physical Exam Neck:     Vascular: No carotid bruit or JVD.  Cardiovascular:     Rate and Rhythm: Normal rate. Rhythm irregular.     Pulses: Normal pulses and intact distal pulses.     Heart sounds: No murmur heard. Pulmonary:     Effort: Pulmonary effort is normal.     Breath sounds: Normal breath sounds.  Abdominal:     General: Bowel sounds are normal.     Palpations: Abdomen is soft.  Musculoskeletal:     Right lower leg: No edema.     Left lower leg: No edema.  Skin:    Capillary Refill: Capillary refill takes less than 2 seconds.     Studies Reviewed: .    Echocardiogram 02/26/2020: 1. Normal LV systolic function with EF 68%. Left ventricle cavity is normal in size. Normal global wall motion. Unable to evaluate diastolic function due to atrial fibrillation. Calculated EF 68%. 2. Left atrial cavity is severely dilated at 4.8 cm. 3. Structurally normal mitral valve. Mild (Grade I) mitral regurgitation. 4. Compared to 08/21/2011, atrial fibrillation is new.  EKG:         EKG 05/04/2022: Normal sinus rhythm at the rate of 87 bpm, normal axis, T wave abnormality, cannot exclude anterolateral ischemia.  Compared to 12/20/2021, T wave inversion slightly more prominent.   EKG 04/29/2020: Atrial fibrillation with controlled ventricular response at rate of 87 bpm, LVH with nonspecific ST-T abnormality, cannot exclude lateral ischemia. No significant change from EKG 04/01/2020:   Medications and allergies    Allergies  Allergen Reactions   Penicillins Rash    Current Meds  Medication Sig   AMBULATORY NON FORMULARY MEDICATION Take 4 tablets by mouth daily. ALJ Herbal  Respiratory  at breakfast   apixaban (ELIQUIS) 5 MG TABS tablet Take 1 tablet (5 mg total) by mouth 2 (two) times daily.   apixaban (ELIQUIS) 5 MG TABS tablet Take 1 tablet (5 mg total) by mouth 2 (two) times  daily.   busPIRone (BUSPAR) 10 MG tablet Take 10 mg by mouth 3 (three) times daily.   Calcium Carbonate-Vitamin D3 600-400 MG-UNIT TABS Take 1 tablet by mouth daily.   cetirizine (ZYRTEC) 10 MG chewable tablet Chew 10 mg by mouth daily.   clonazePAM (KLONOPIN) 0.5 MG tablet Take 0.5 mg by mouth daily.   diltiazem (CARDIZEM CD) 360 MG 24 hr capsule TAKE 1 CAPSULE BY MOUTH EVERY DAY   fluticasone (FLONASE) 50 MCG/ACT nasal spray Place 1 spray into both nostrils daily.   lisinopril (ZESTRIL) 20 MG tablet TAKE 1 TABLET BY MOUTH EVERY DAY IN THE EVENING   naltrexone (DEPADE) 50 MG tablet Take 1 tablet (50 mg total) by mouth daily. 1/2 (Half) tab daily for 3 days then one tab daily   pantoprazole (PROTONIX) 40 MG tablet Take 40 mg by mouth daily.   propranolol ER (INDERAL LA) 120 MG 24 hr capsule 1 tablet daily   Propylene Glycol (SYSTANE COMPLETE OP) Place 1 drop into both eyes daily as needed (Dry eyes).   venlafaxine (EFFEXOR) 75 MG tablet Take 75 mg by mouth daily.    [DISCONTINUED] dronedarone (MULTAQ) 400 MG tablet TAKE 1 TABLET BY MOUTH 2 (TWO) TIMES DAILY WITH A MEAL. START 5 DAYS AFTER STOPPING FLECAINIDE     ASSESSMENT AND PLAN: .      ICD-10-CM   1. Essential hypertension  I10     2. Paroxysmal atrial fibrillation (HCC)  I48.0 apixaban (ELIQUIS) 5 MG TABS tablet    ECHOCARDIOGRAM COMPLETE    CBC    Lipid Profile    Comp Met (CMET)    apixaban (ELIQUIS) 5 MG TABS tablet    dronedarone (MULTAQ) 400 MG tablet    3. Alcoholic hepatitis without ascites  K70.10 naltrexone (DEPADE) 50 MG tablet     CHA2DS2-VASc Score is 2.  Yearly risk of stroke: 2.3% (HTN, F).  Score of 1=0.6; 2=2.2; 3=3.2; 4=4.8; 5=7.2; 6=9.8; 7=>9.8) -(CHF; HTN; vasc disease DM,  Female = 1; Age <65 =0;  65-74 = 1,  >75 =2; stroke/embolism= 2).    Assessment and Plan  Atrial Fibrillation Patient found to be in AFib during visit, despite being on Multaq. Discussed the need for cardioversion and anticoagulation.  Patient also has Apple Watch and upon review of her EKG strips, she was in A-fib as of 12/27/2022.  Prior to that she had been maintaining sinus rhythm.  No data since 12/27/2022.  I have drained the patient to follow-up on the Apple watch and also she could upload heart rhythm strip to [place].  As she may have PAF, I would like to order repeat EKG in about 2 weeks prior to setting her up for cardioversion.  -Start Eliquis 5mg  BID. -Schedule echocardiogram. -Schedule cardioversion in three to four weeks. -Return in two weeks for EKG to assess rhythm. -Follow-up visit in two months with APP, and three to 6 months with me.    Alcohol Use Disorder Patient reports difficulty with cessation, intermittent use. Currently engaged in [place] and self-guided meditation. Elevated liver enzymes and fatty liver noted, with concern for progression to cirrhosis. Discussed the use of Naltrexone for alcohol cessation and weight loss. -Start Naltrexone, beginning with half tablet for three days, then one tablet daily.  Weight Management Discussed the potential benefit of Naltrexone for weight loss in conjunction with alcohol cessation. -Start Naltrexone as above.  -She will follow-up with her PCP for management of alcohol dependence and also for continuation of naltrexone if appropriate.  She could also potentially consider starting Wellbutrin with naltrexone both for depression and anxiety along with weight loss and for alcohol abstinence.  Encouraged her to continue AA meetings.   Anxiety Patient reports improvement but still present. Blood pressure within normal limits during visit. -Continue current management strategies.  Primary hypertension Blood pressure is well-controlled on diltiazem CD3 60 mg  daily along with propranolol LA 120 mg daily.  Continue the same.     Informed Consent   Shared Decision Making/Informed Consent The risks (stroke, cardiac arrhythmias rarely resulting in the need for a temporary or permanent pacemaker, skin irritation or burns and complications associated with conscious sedation including aspiration, arrhythmia, respiratory failure and death), benefits (restoration of normal sinus rhythm) and alternatives of a direct current cardioversion were explained in detail to Ms. Gwendolyn Thompson and she agrees to proceed.   This was a complex office visit encounter and extended office visit encounter in discussions regarding need for anticoagulation, arraignment and coordination of care, evaluation of labs that are external, discussion regarding risks benefits of anticoagulation, discussion regarding alcohol abuse and alcoholic hepatitis and therapy.  Addendum: I started her on Nalaxone for alcoholism. She may need a psychiatrist. She was back in A. Fib and have started her back on Eliquis. Low sodium probably from alcohol as well, but worth a shot to check SIADH panel and have forwarded the labs to PCP. Low sodium for the last many years especially last 2 years in 125 range.   Signed,  Yates Decamp, MD, [facility] 01/03/2023, 5:17 AM [facility] [address] [place], [place] [address] Phone: [phone]. Fax:  [phone]

## 2023-01-03 LAB — COMPREHENSIVE METABOLIC PANEL
ALT: 52 [IU]/L — ABNORMAL HIGH (ref 0–32)
AST: 124 [IU]/L — ABNORMAL HIGH (ref 0–40)
Albumin: 4.4 g/dL (ref 3.9–4.9)
Alkaline Phosphatase: 135 [IU]/L — ABNORMAL HIGH (ref 44–121)
BUN/Creatinine Ratio: 7 — ABNORMAL LOW (ref 12–28)
BUN: 6 mg/dL — ABNORMAL LOW (ref 8–27)
Bilirubin Total: 0.5 mg/dL (ref 0.0–1.2)
CO2: 22 mmol/L (ref 20–29)
Calcium: 9.7 mg/dL (ref 8.7–10.3)
Chloride: 87 mmol/L — ABNORMAL LOW (ref 96–106)
Creatinine, Ser: 0.82 mg/dL (ref 0.57–1.00)
Globulin, Total: 3.4 g/dL (ref 1.5–4.5)
Glucose: 83 mg/dL (ref 70–99)
Potassium: 5.3 mmol/L — ABNORMAL HIGH (ref 3.5–5.2)
Sodium: 125 mmol/L — ABNORMAL LOW (ref 134–144)
Total Protein: 7.8 g/dL (ref 6.0–8.5)
eGFR: 81 mL/min/{1.73_m2} (ref >59)

## 2023-01-03 LAB — CBC
Hematocrit: 34.8 % (ref 34.0–46.6)
Hemoglobin: 11.8 g/dL (ref 11.1–15.9)
MCH: 33.5 pg — ABNORMAL HIGH (ref 26.6–33.0)
MCHC: 33.9 g/dL (ref 31.5–35.7)
MCV: 99 fL — ABNORMAL HIGH (ref 79–97)
Platelets: 358 10*3/uL (ref 150–450)
RBC: 3.52 x10E6/uL — ABNORMAL LOW (ref 3.77–5.28)
RDW: 12.6 % (ref 11.7–15.4)
WBC: 6.3 10*3/uL (ref 3.4–10.8)

## 2023-01-03 LAB — LIPID PANEL
Chol/HDL Ratio: 5.8 ratio — ABNORMAL HIGH (ref 0.0–4.4)
Cholesterol, Total: 313 mg/dL — ABNORMAL HIGH (ref 100–199)
HDL: 54 mg/dL (ref >39)
LDL Chol Calc (NIH): 182 mg/dL — ABNORMAL HIGH (ref 0–99)
Triglycerides: 389 mg/dL — ABNORMAL HIGH (ref 0–149)
VLDL Cholesterol Cal: 77 mg/dL — ABNORMAL HIGH (ref 5–40)

## 2023-01-03 MED ORDER — MULTAQ 400 MG PO TABS: 400.0000 mg | ORAL_TABLET | Freq: Two times a day (BID) | ORAL | 1 refills | Status: DC

## 2023-01-03 NOTE — Addendum Note (Signed)
Addended by: Delrae Rend on: 01/03/2023 05:17 AM   Modules accepted: Orders

## 2023-01-12 IMAGING — MG DIGITAL SCREENING BILAT W/ TOMO W/ CAD
8 series · 9 of 24 positions shown · non-contrast
Comparison: Previous exam(s).

CLINICAL DATA: Screening.

EXAM:
DIGITAL SCREENING BILATERAL MAMMOGRAM WITH TOMO AND CAD

[R MLO synth-2D]
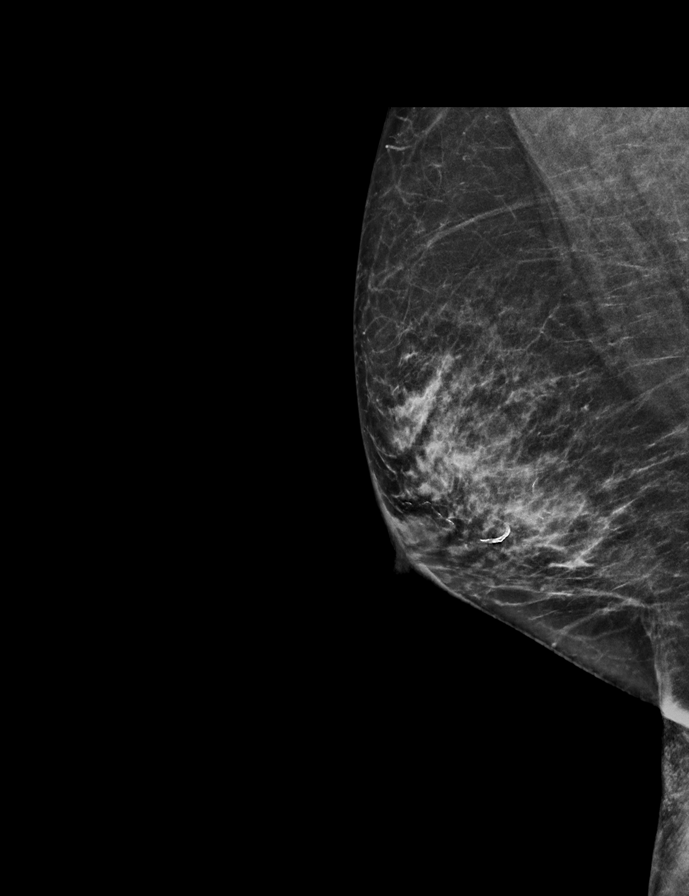

[R CC synth-2D]
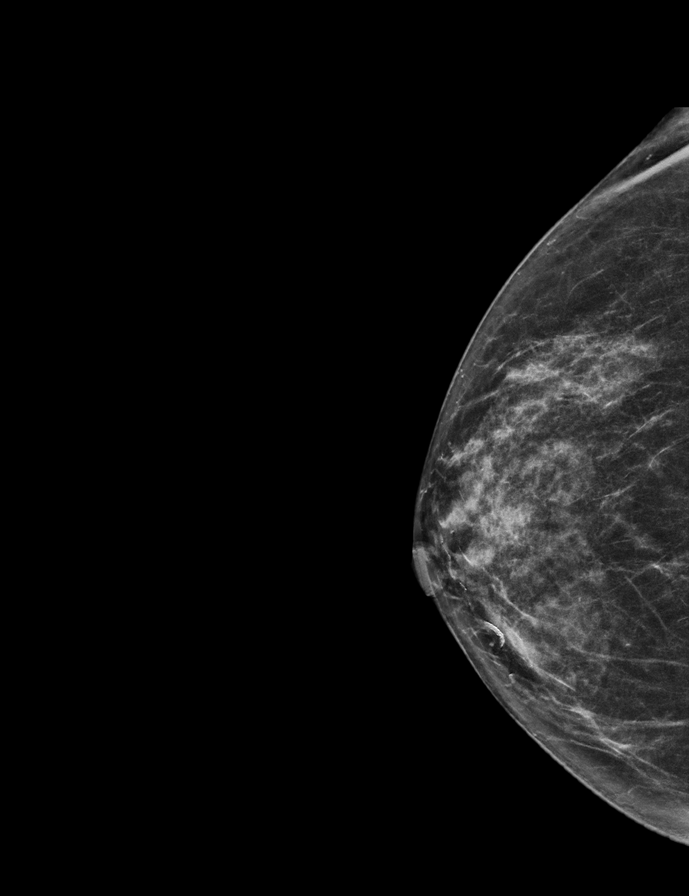

[L MLO synth-2D]
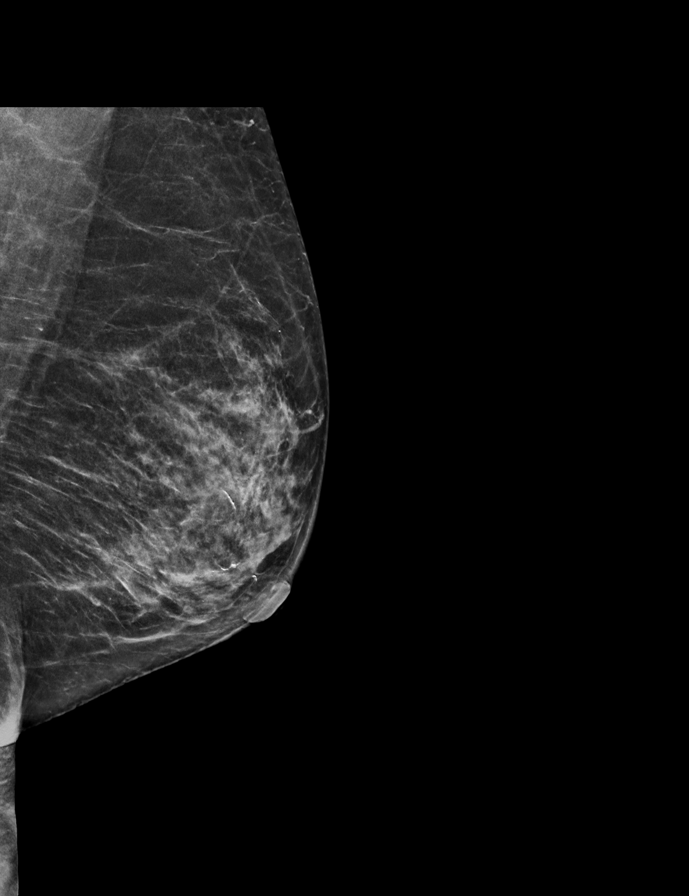

[L CC synth-2D]
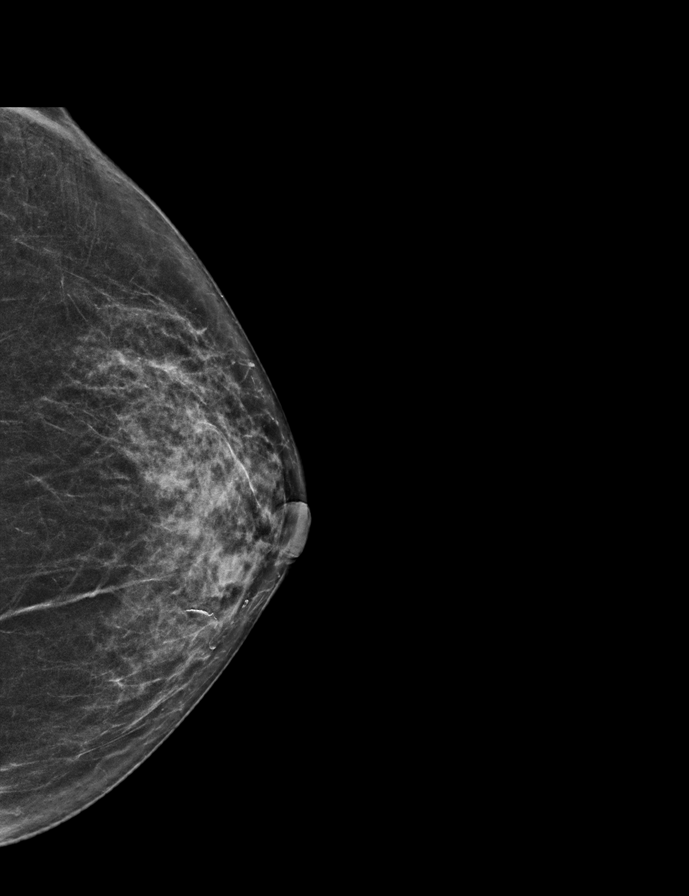

[L MLO tomo · 2 of 59 frames shown]
[frame 20/59]
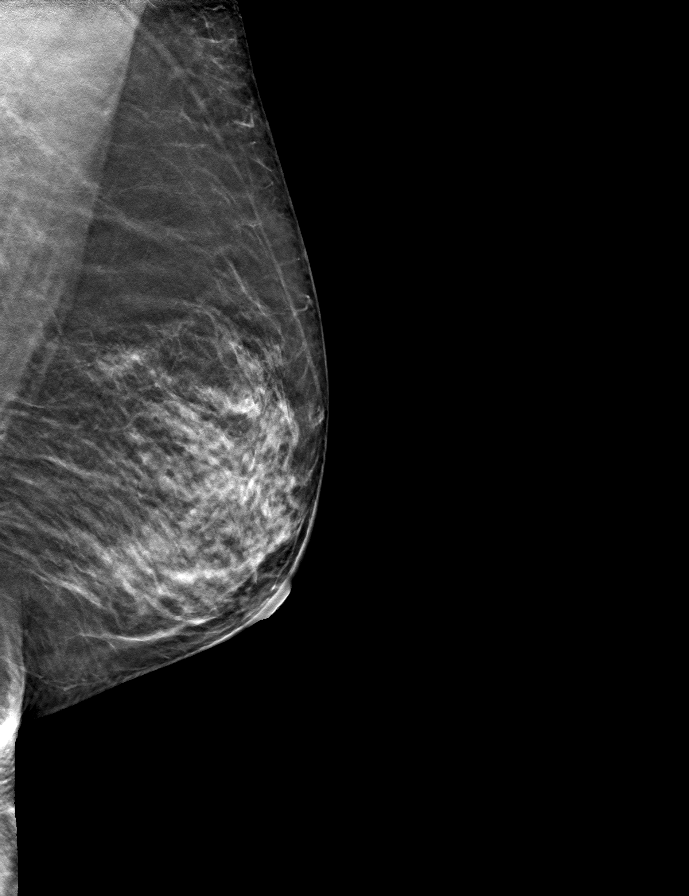
[frame 30/59]
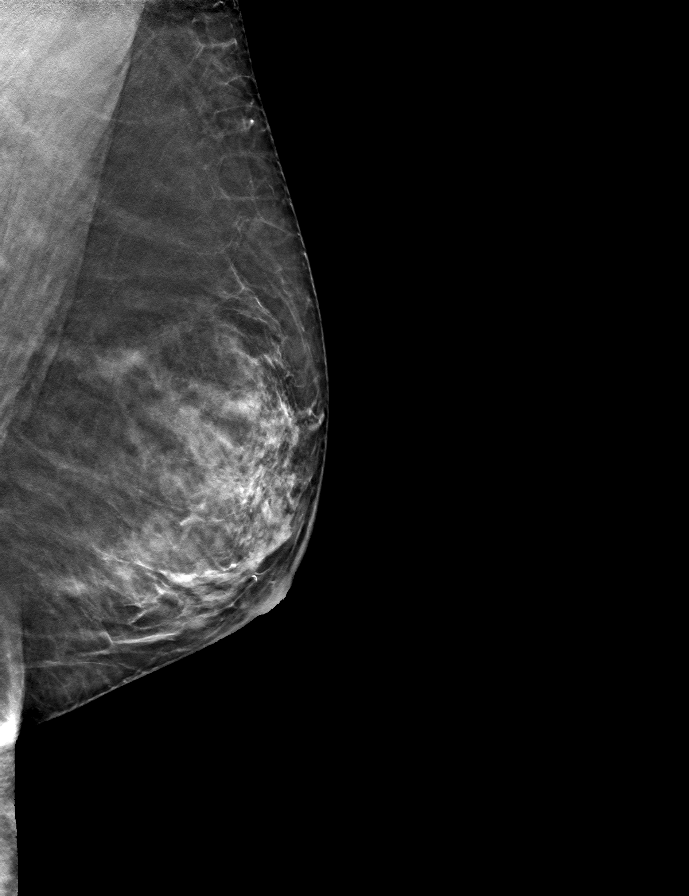

[R CC tomo · tomo slice 30/59.0]
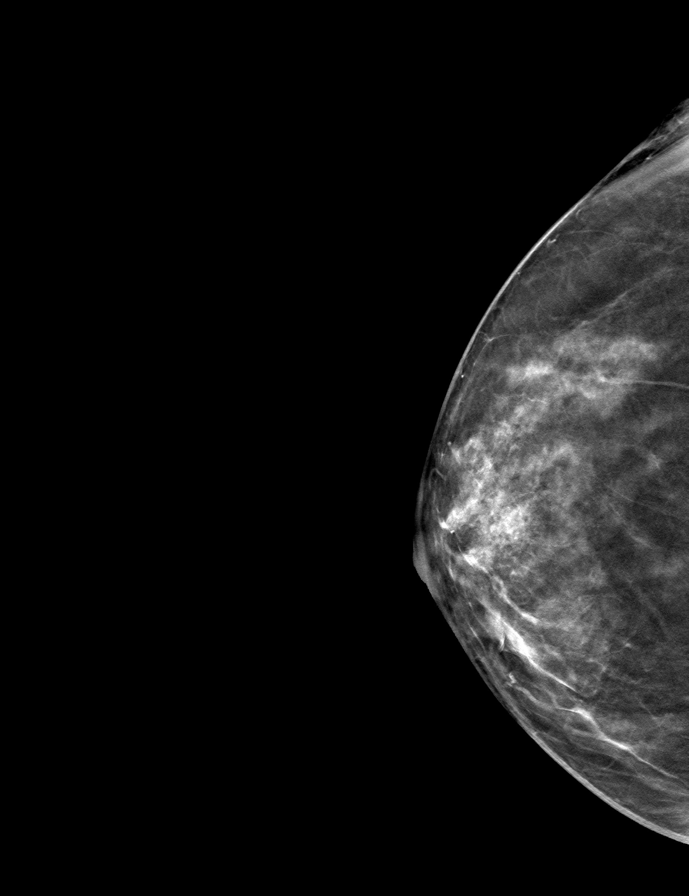

[L CC tomo · tomo slice 29/58.0]
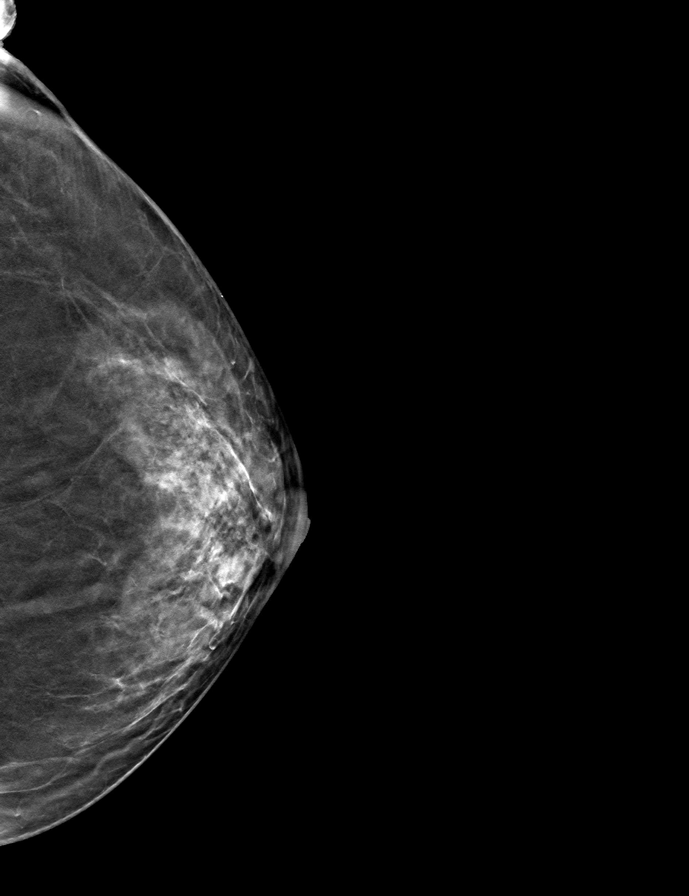

[R MLO tomo · tomo slice 29/57.0]
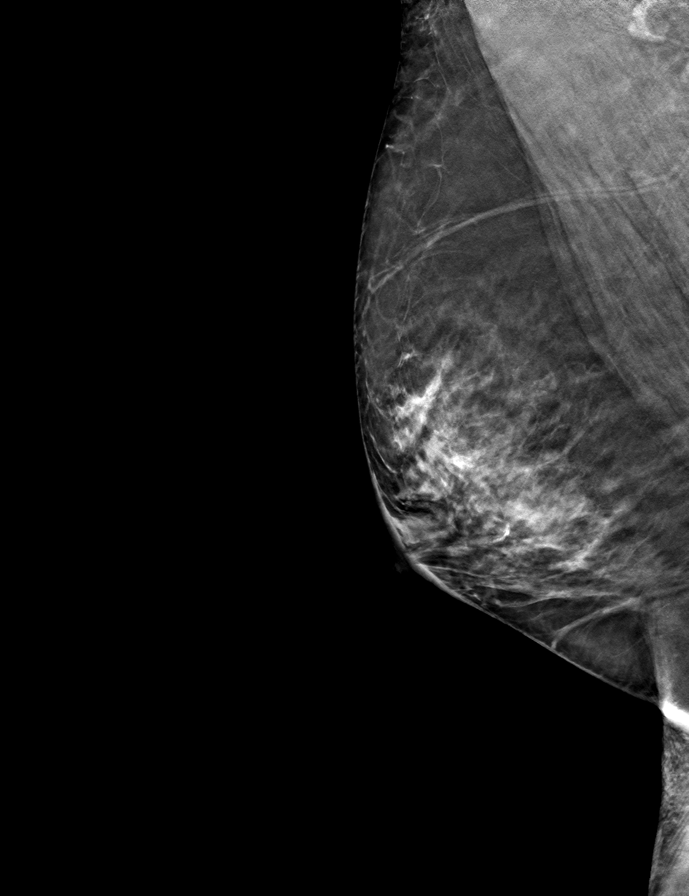

[9 of 24 positions shown; findings below may reference images not displayed]

ACR Breast Density Category c: The breast tissue is heterogeneously
dense, which may obscure small masses.
FINDINGS: There are no findings suspicious for malignancy. Images were
processed with CAD.
IMPRESSION: No mammographic evidence of malignancy. A result letter of this
screening mammogram will be mailed directly to the patient.

RECOMMENDATION:
Screening mammogram in one year. (Code:[id number])

BI-RADS CATEGORY  1: Negative.

## 2023-01-16 ENCOUNTER — Ambulatory Visit: Payer: BC Managed Care – PPO | Attending: Internal Medicine

## 2023-01-16 VITALS — BP 132/64 | HR 70 | Ht 61.0 in | Wt 137.0 lb

## 2023-01-16 DIAGNOSIS — I480 Paroxysmal atrial fibrillation: Secondary | ICD-10-CM

## 2023-01-16 NOTE — Patient Instructions (Signed)
Medication Instructions:  Your physician recommends that you continue on your current medications as directed. Please refer to the Current Medication list given to you today.  *If you need a refill on your cardiac medications before your next appointment, please call your pharmacy*  Lab Work: If you have labs (blood work) drawn today and your tests are completely normal, you will receive your results only by: MyChart Message (if you have MyChart) OR A paper copy in the mail If you have any lab test that is abnormal or we need to change your treatment, we will call you to review the results.   Follow-Up: At [facility], you and your health needs are our priority.  As part of our continuing mission to provide you with exceptional heart care, we have created designated Provider Care Teams.  These Care Teams include your primary Cardiologist (physician) and Advanced Practice Providers (APPs -  Physician Assistants and Nurse Practitioners) who all work together to provide you with the care you need, when you need it.  We recommend signing up for the patient portal called "MyChart".  Sign up information is provided on this After Visit Summary.  MyChart is used to connect with patients for Virtual Visits (Telemedicine).  Patients are able to view lab/test results, encounter notes, upcoming appointments, etc.  Non-urgent messages can be sent to your provider as well.   To learn more about what you can do with MyChart, go to [email or web address].    Your next appointment:   Keep already scheduled follow-up  Other Instructions Your cardioversion for 01/30/23 has been canceled.

## 2023-01-16 NOTE — Progress Notes (Signed)
   Nurse Visit   Date of Encounter: 01/16/2023 ID: Gwendolyn Thompson, DOB Oct 14, 1960, MRN [id number]  PCP:  Loyola Mast, NP   [facility] Providers Cardiologist:  Yates Decamp, MD      Visit Details   VS:  BP 132/64 (BP Location: Left Arm, Patient Position: Sitting, Cuff Size: Normal)   Pulse 70   Ht 5\' 1"  (1.549 m)   Wt 137 lb (62.1 kg)   BMI 25.89 kg/m  , BMI Body mass index is 25.89 kg/m.  Wt Readings from Last 3 Encounters:  01/16/23 137 lb (62.1 kg)  01/02/23 137 lb 9.6 oz (62.4 kg)  07/28/22 131 lb 12.8 oz (59.8 kg)     Reason for visit: EKG Performed today: Vitals, EKG, Provider consulted:Dr. Ladona Ridgel, and Education Changes (medications, testing, etc.) : Patient in NS rhythm per Dr. Ladona Ridgel, DOD, cancel cardioversion. Length of Visit: 15 minutes    Medications Adjustments/Labs and Tests Ordered: Orders Placed This Encounter  Procedures   EKG 12-Lead   No orders of the defined types were placed in this encounter.    Signed, Ethelda Chick, RN  01/16/2023 11:58 AM

## 2023-01-19 ENCOUNTER — Other Ambulatory Visit: Payer: Self-pay | Admitting: *Deleted

## 2023-01-19 DIAGNOSIS — F411 Generalized anxiety disorder: Secondary | ICD-10-CM

## 2023-01-19 DIAGNOSIS — I480 Paroxysmal atrial fibrillation: Secondary | ICD-10-CM

## 2023-01-19 MED ORDER — PROPRANOLOL HCL ER 120 MG PO CP24: ORAL_CAPSULE | ORAL | 3 refills | Status: DC

## 2023-01-23 NOTE — Telephone Encounter (Signed)
Reviewed with Dr Jacinto Halim and patient does not need previously scheduled post cardioversion appointment with Jari Favre, PA on 02/20/23.

## 2023-01-30 ENCOUNTER — Encounter (HOSPITAL_COMMUNITY): Payer: Self-pay

## 2023-01-30 ENCOUNTER — Ambulatory Visit (HOSPITAL_COMMUNITY): Admit: 2023-01-30 | Payer: BC Managed Care – PPO | Admitting: Internal Medicine

## 2023-01-30 SURGERY — CARDIOVERSION (CATH LAB)
Anesthesia: General

## 2023-02-12 ENCOUNTER — Ambulatory Visit (HOSPITAL_COMMUNITY): Payer: BC Managed Care – PPO | Attending: Cardiology

## 2023-02-12 DIAGNOSIS — I480 Paroxysmal atrial fibrillation: Secondary | ICD-10-CM | POA: Insufficient documentation

## 2023-02-12 LAB — ECHOCARDIOGRAM COMPLETE
Area-P 1/2: 2.91 cm2
S' Lateral: 1.9 cm

## 2023-02-20 ENCOUNTER — Ambulatory Visit: Payer: BC Managed Care – PPO | Admitting: Physician Assistant

## 2023-03-07 ENCOUNTER — Encounter: Payer: Self-pay | Admitting: Cardiology

## 2023-04-05 ENCOUNTER — Ambulatory Visit: Payer: BC Managed Care – PPO | Admitting: Cardiology

## 2023-04-11 ENCOUNTER — Ambulatory Visit: Payer: BC Managed Care – PPO | Admitting: Cardiology

## 2023-04-24 ENCOUNTER — Encounter: Payer: Self-pay | Admitting: Cardiology

## 2023-04-24 ENCOUNTER — Ambulatory Visit: Payer: BC Managed Care – PPO | Attending: Cardiology | Admitting: Cardiology

## 2023-04-24 VITALS — BP 102/68 | HR 63 | Resp 16 | Ht 61.0 in | Wt 123.0 lb

## 2023-04-24 DIAGNOSIS — I10 Essential (primary) hypertension: Secondary | ICD-10-CM | POA: Diagnosis not present

## 2023-04-24 DIAGNOSIS — I480 Paroxysmal atrial fibrillation: Secondary | ICD-10-CM

## 2023-04-24 DIAGNOSIS — K7010 Alcoholic hepatitis without ascites: Secondary | ICD-10-CM

## 2023-04-24 NOTE — Progress Notes (Signed)
 Cardiology Office Note:  .   Date:  04/24/2023  ID:  Gwendolyn Thompson, DOB 17-Mar-1960, MRN [id number] PCP: Gwendolyn Mast, NP   HeartCare Providers Cardiologist:  Yates Decamp, MD   History of Present Illness: .   Gwendolyn Thompson is a 63 y.o. Caucasian female  with hypertension, hyperlipidemia, history of basal cell carcinoma, with brief episodes of A fib and atrial tachycardia on 4 day monitor performed by PCP on 07/31/2018, was back in sustained A. Fib on 02/24/2020, underwent unsuccessful Direct-current cardioversion on 04/13/2020.  She had 1 more episode of atrial fibrillation and spontaneously converted to sinus rhythm on Multaq.  She has not had any recurrence of atrial fibrillation since middle of 2022 and Multaq and anticoagulation was discontinued per patient wishes.  She also has history of alcoholic hepatitis.  This is a 70-month office visit.   Discussed the use of AI scribe software for clinical note transcription with the patient, who gave verbal consent to proceed.  History of Present Illness   The patient, with a history of alcoholism, has been trying to quit drinking for the past few days. She acknowledges the need for rehab and is aware of the potential for developing cirrhosis due to her alcohol consumption. She has a family history of alcoholism and is concerned about her own health. She has a supportive partner who encourages her to quit drinking. The patient has not been eating well recently, despite her partner cooking healthy meals. She expresses fear about her health and the potential for liver damage, but also expresses hope that she can restore her liver function and manage her high cholesterol.      Labs   Lab Results  Component Value Date   CHOL 313 (H) 01/02/2023   HDL 54 01/02/2023   LDLCALC 182 (H) 01/02/2023   TRIG 389 (H) 01/02/2023   CHOLHDL 5.8 (H) 01/02/2023   Lab Results  Component Value Date   NA 125 (L) 01/02/2023   K 5.3 (H) 01/02/2023    CO2 22 01/02/2023   GLUCOSE 83 01/02/2023   BUN 6 (L) 01/02/2023   CREATININE 0.82 01/02/2023   CALCIUM 9.7 01/02/2023   EGFR 81 01/02/2023   GFRNONAA 67 04/07/2020      Latest Ref Rng & Units 01/02/2023   10:01 AM 04/07/2020   10:40 AM 09/18/2019    8:37 AM  BMP  Glucose 70 - 99 mg/dL 83  782  94   BUN 8 - 27 mg/dL 6  12  9    Creatinine 0.57 - 1.00 mg/dL 9.56  2.13  0.86   BUN/Creat Ratio 12 - 28 7  13  15    Sodium 134 - 144 mmol/L 125  128  131   Potassium 3.5 - 5.2 mmol/L 5.3  4.6  4.5   Chloride 96 - 106 mmol/L 87  88  88   CO2 20 - 29 mmol/L 22  22  25    Calcium 8.7 - 10.3 mg/dL 9.7  9.8  9.4       Latest Ref Rng & Units 01/02/2023   10:01 AM 04/14/2010   10:30 PM  CBC  WBC 3.4 - 10.8 x10E3/uL 6.3  6.6   Hemoglobin 11.1 - 15.9 g/dL 57.8  46.9   Hematocrit 34.0 - 46.6 % 34.8  37.1   Platelets 150 - 450 x10E3/uL 358  268    External Labs:  Care everywhere Labs 03/07/2023:  Sodium 122, potassium 4.7, BUN 7, creatinine 1.38, EGFR 48  mL. Alkaline phosphatase 125, AST 99, ALT 39.  Hb 11.5/HCT 34.0, platelets 333.  Urinary protein to creatinine ratio 967.  Labs 09/12/2021:  TSH normal at 0.95.  A1c normal at 5.6.  Review of Systems  Cardiovascular:  Negative for chest pain, dyspnea on exertion and leg swelling.   Physical Exam:   VS:  BP 102/68 (BP Location: Left Arm, Patient Position: Sitting, Cuff Size: Normal)   Pulse 63   Resp 16   Ht 5\' 1"  (1.549 m)   Wt 123 lb (55.8 kg)   SpO2 98%   BMI 23.24 kg/m    Wt Readings from Last 3 Encounters:  04/24/23 123 lb (55.8 kg)  01/16/23 137 lb (62.1 kg)  01/02/23 137 lb 9.6 oz (62.4 kg)     Physical Exam Neck:     Vascular: No carotid bruit or JVD.  Cardiovascular:     Rate and Rhythm: Normal rate and regular rhythm.     Pulses: Intact distal pulses.     Heart sounds: Normal heart sounds. No murmur heard.    No gallop.  Pulmonary:     Effort: Pulmonary effort is normal.     Breath sounds: Normal breath  sounds.  Abdominal:     General: Bowel sounds are normal.     Palpations: Abdomen is soft.  Musculoskeletal:     Right lower leg: No edema.     Left lower leg: No edema.    Studies Reviewed: Marland Kitchen    Lexiscan Sestamibi Stress Test 09/11/2018: Resting EKG normal sinus rhythm, LVH, occasional PVCs.  Stress EKG nondiagnostic due to pharmacologic stress testing.  Hypertensive throughout the study with resting blood pressure 160/110 mmHg. Myocardial perfusion imaging is normal. Left ventricular ejection fraction is  74% with normal wall motion. Low risk study.  ECHOCARDIOGRAM COMPLETE 02/12/2023  1. Left ventricular ejection fraction, by estimation, is 60 to 65%. The left ventricle has normal function. The left ventricle has no regional wall motion abnormalities. There is mild left ventricular hypertrophy of the septal segment. Left ventricular diastolic parameters are consistent with Grade I diastolic dysfunction (impaired relaxation). 2. Right ventricular systolic function is normal. The right ventricular size is normal. 3. Left atrial size was severely dilated. 4. The mitral valve is normal in structure. No evidence of mitral valve regurgitation. No evidence of mitral stenosis. 5. The aortic valve is normal in structure. Aortic valve regurgitation is not visualized. No aortic stenosis is present. 6. The inferior vena cava is normal in size with greater than 50% respiratory variability, suggesting right atrial pressure of 3 mmHg.  EKG:    EKG Interpretation Date/Time:  Tuesday April 24 2023 09:02:42 EDT Ventricular Rate:  63 PR Interval:  150 QRS Duration:  74 QT Interval:  454 QTC Calculation: 464 R Axis:   18  Text Interpretation: EKG 04/24/2023: Normal sinus rhythm at rate of 63 bpm, diffuse anterolateral and inferior T wave abnormality, cannot exclude ischemia.  Compared to 01/16/2023, T wave abnormality more pronounced. Confirmed by Delrae Rend [phone]) on 04/24/2023 9:10:37 AM      Medications and allergies    Allergies  Allergen Reactions   Penicillins Rash     Current Outpatient Medications:    AMBULATORY NON FORMULARY MEDICATION, Take 4 tablets by mouth daily. ALJ Herbal Respiratory  at breakfast, Disp: , Rfl:    Calcium Carbonate-Vitamin D3 600-400 MG-UNIT TABS, Take 1 tablet by mouth daily., Disp: , Rfl:    cetirizine (ZYRTEC) 10 MG chewable tablet, Chew 10 mg  by mouth daily., Disp: , Rfl:    diltiazem (CARDIZEM CD) 360 MG 24 hr capsule, TAKE 1 CAPSULE BY MOUTH EVERY DAY, Disp: 90 capsule, Rfl: 3   dronedarone (MULTAQ) 400 MG tablet, Take 1 tablet (400 mg total) by mouth 2 (two) times daily with a meal., Disp: 180 tablet, Rfl: 1   fluticasone (FLONASE) 50 MCG/ACT nasal spray, Place 1 spray into both nostrils daily., Disp: , Rfl:    lisinopril (ZESTRIL) 20 MG tablet, TAKE 1 TABLET BY MOUTH EVERY DAY IN THE EVENING, Disp: 90 tablet, Rfl: 3   pantoprazole (PROTONIX) 40 MG tablet, Take 40 mg by mouth daily., Disp: , Rfl:    propranolol ER (INDERAL LA) 120 MG 24 hr capsule, 1 tablet daily, Disp: 90 capsule, Rfl: 3   Propylene Glycol (SYSTANE COMPLETE OP), Place 1 drop into both eyes daily as needed (Dry eyes)., Disp: , Rfl:    venlafaxine (EFFEXOR) 75 MG tablet, Take 75 mg by mouth daily. , Disp: , Rfl:    ASSESSMENT AND PLAN: .      ICD-10-CM   1. Paroxysmal atrial fibrillation (HCC)  I48.0 EKG 12-Lead    2. Essential hypertension  I10     3. Alcoholic hepatitis without ascites  K70.10       1. Paroxysmal atrial fibrillation (HCC) Patient is maintaining sinus rhythm, on anticoagulation with Eliquis 5 mg twice daily.  Presently on Multaq 400 mg twice daily and is maintaining sinus rhythm along with propranolol ER 120 mg daily.  She does have abnormal EKG suggestive ischemia however she has had a negative nuclear stress test in 2020, echocardiogram that was done just 3 months ago reveals preserved LVEF.  2. Essential hypertension Blood pressure is  well-controlled on combination of diltiazem CD3 60 mg daily along with Inderal LA 120 mg daily.  3. Alcoholic hepatitis without ascites Extensive discussion with the patient regarding complete avoidance of alcohol, reviewed her ultrasound of the abdomen, reviewed her LFTs and urinary creatinine ratio as well.  She has high lipids however not on therapy in view of abnormal LFTs.  Office visit in 6 months or sooner if problems.  Assessment and Plan    Alcohol Use Disorder   Swan's alcohol use disorder severely impacts her liver function and overall health. She has abstained from alcohol for a few days but needs structured support to maintain sobriety. There is a high risk of cirrhosis and liver cancer if alcohol consumption continues. Complete abstinence could allow her to live for decades without major liver issues. Refer her to inpatient rehabilitation and encourage participation in Alcoholics Anonymous. Consider prescribing disulfiram to deter alcohol consumption. Discuss with her partner Tamela Oddi about supporting her recovery.  Alcoholic Hepatitis and Steatosis   Liver function tests indicate alcoholic hepatitis and steatosis, with diffuse increased hepatic parenchymal echogenicity. The liver damage is not yet cirrhosis but is progressing. While the damage cannot be fully reversed, liver function can improve significantly with complete abstinence from alcohol. Advise complete abstinence to prevent progression to cirrhosis and monitor liver function tests regularly.  Hyperlipidemia   Jilliam has hyperlipidemia, but starting lipid-lowering medications is not advised due to abnormal liver enzymes. High cholesterol may not resolve with alcohol cessation, but it requires careful management considering her liver condition. Monitor cholesterol levels and reassess the need for lipid-lowering medications once liver function stabilizes.           Signed,  Yates Decamp, MD, [facility] 04/24/2023, 9:28 AM [facility]  [facility] [address]  [address] [place], [place] [address] Phone: [phone]. Fax:  [phone]

## 2023-04-24 NOTE — Patient Instructions (Signed)
 Medication Instructions:  Your physician recommends that you continue on your current medications as directed. Please refer to the Current Medication list given to you today.  *If you need a refill on your cardiac medications before your next appointment, please call your pharmacy*   Lab Work: none If you have labs (blood work) drawn today and your tests are completely normal, you will receive your results only by: MyChart Message (if you have MyChart) OR A paper copy in the mail If you have any lab test that is abnormal or we need to change your treatment, we will call you to review the results.   Testing/Procedures: none   Follow-Up: At [facility], you and your health needs are our priority.  As part of our continuing mission to provide you with exceptional heart care, we have created designated Provider Care Teams.  These Care Teams include your primary Cardiologist (physician) and Advanced Practice Providers (APPs -  Physician Assistants and Nurse Practitioners) who all work together to provide you with the care you need, when you need it.  We recommend signing up for the patient portal called "MyChart".  Sign up information is provided on this After Visit Summary.  MyChart is used to connect with patients for Virtual Visits (Telemedicine).  Patients are able to view lab/test results, encounter notes, upcoming appointments, etc.  Non-urgent messages can be sent to your provider as well.   To learn more about what you can do with MyChart, go to [email or web address].    Your next appointment:   6 month(s)  Provider:   Yates Decamp, MD     Other Instructions

## 2023-05-04 ENCOUNTER — Other Ambulatory Visit: Payer: Self-pay | Admitting: Family Medicine

## 2023-05-04 DIAGNOSIS — Z0000 Encounter for general adult medical examination without abnormal findings: Secondary | ICD-10-CM

## 2023-05-31 ENCOUNTER — Other Ambulatory Visit (HOSPITAL_COMMUNITY): Payer: Self-pay

## 2023-05-31 ENCOUNTER — Encounter: Payer: Self-pay | Admitting: Pharmacy Technician

## 2023-05-31 ENCOUNTER — Telehealth: Payer: Self-pay | Admitting: Pharmacy Technician

## 2023-05-31 ENCOUNTER — Encounter: Payer: Self-pay | Admitting: Cardiology

## 2023-05-31 DIAGNOSIS — I480 Paroxysmal atrial fibrillation: Secondary | ICD-10-CM

## 2023-05-31 NOTE — Telephone Encounter (Signed)
 Multaq coupon info:  Bin: [id number]  PcnEliott Guess: [id number] Group: [id number]  I called [facility] and they ran on the coupon and now it is 0.00 for 90 days. I sent the patient a mychart message

## 2023-06-01 MED ORDER — FLECAINIDE ACETATE 50 MG PO TABS: 50.0000 mg | ORAL_TABLET | Freq: Two times a day (BID) | ORAL | 1 refills | Status: DC

## 2023-06-01 NOTE — Addendum Note (Signed)
 Addended by: Federico Hopkins on: 06/01/2023 10:18 AM   Modules accepted: Orders

## 2023-06-01 NOTE — Telephone Encounter (Signed)
 Please schedule her for routine GXT in 2 weeks

## 2023-06-01 NOTE — Telephone Encounter (Signed)
 Orders Placed This Encounter  Procedures   Cardiac Stress Test: Informed Consent Details: Physician/Practitioner Attestation; Transcribe to consent form and obtain patient signature    Physician/Practitioner attestation of informed consent for procedure/surgical case:   I, the physician/practitioner, attest that I have discussed with the patient the benefits, risks, side effects, alternatives, likelihood of achieving goals and potential problems during recovery for the procedure that I have provided informed consent.    Procedure:   Treadmill exercise stress test    Indication/Reason:   Therapeutic drug monitoring   Exercise Tolerance Test    Standing Status:   Future    Expected Date:   06/15/2023    Expiration Date:   05/31/2024    Where should this test be performed:   [facility] [facility]    Stress with pharmacologic or treadmill ?:   Treadmill w/ exercise    Meds ordered this encounter  Medications   flecainide  (TAMBOCOR ) 50 MG tablet    Sig: Take 1 tablet (50 mg total) by mouth 2 (two) times daily.    Dispense:  60 tablet    Refill:  1

## 2023-06-01 NOTE — Telephone Encounter (Signed)
 ICD-10-CM   1. Paroxysmal atrial fibrillation (HCC)  I48.0 flecainide  (TAMBOCOR ) 50 MG tablet     Medications Discontinued During This Encounter  Medication Reason   dronedarone  (MULTAQ ) 400 MG tablet Cost of medication    Meds ordered this encounter  Medications   flecainide  (TAMBOCOR ) 50 MG tablet    Sig: Take 1 tablet (50 mg total) by mouth 2 (two) times daily.    Dispense:  60 tablet    Refill:  1

## 2023-06-01 NOTE — Telephone Encounter (Signed)
 GXT ordered and to be done only if she goes on Flecainide 

## 2023-06-04 MED ORDER — MULTAQ 400 MG PO TABS: 400.0000 mg | ORAL_TABLET | Freq: Two times a day (BID) | ORAL | 1 refills | Status: DC

## 2023-06-04 NOTE — Telephone Encounter (Signed)
 Patient will continue Multaq  since she can get it for free.  Dr Berry Bristol aware.  My chart message sent to patient.  Pharmacy notified and they will cancel Flecainide  prescription.  Pharmacy confirms patient picked up Multaq  recently and has not picked up flecainide .

## 2023-06-04 NOTE — Addendum Note (Signed)
 Addended by: Dequan Kindred L on: 06/04/2023 12:27 PM   Modules accepted: Orders

## 2023-06-04 NOTE — Addendum Note (Signed)
 Addended by: Kavitha Lansdale L on: 06/04/2023 08:27 AM   Modules accepted: Orders

## 2023-07-16 ENCOUNTER — Encounter: Payer: Self-pay | Admitting: Cardiology

## 2023-07-16 NOTE — Telephone Encounter (Signed)
 Please review and advise.

## 2023-07-24 ENCOUNTER — Ambulatory Visit
Admission: RE | Admit: 2023-07-24 | Discharge: 2023-07-24 | Disposition: A | Discharge status: Home / Self Care | Source: Ambulatory Visit | Attending: Family Medicine | Admitting: Family Medicine

## 2023-07-24 DIAGNOSIS — Z0000 Encounter for general adult medical examination without abnormal findings: Secondary | ICD-10-CM

## 2023-07-27 ENCOUNTER — Encounter: Payer: Self-pay | Admitting: Cardiology

## 2023-07-27 DIAGNOSIS — I10 Essential (primary) hypertension: Secondary | ICD-10-CM

## 2023-07-27 MED ORDER — LISINOPRIL 20 MG PO TABS: 20.0000 mg | ORAL_TABLET | Freq: Every evening | ORAL | 2 refills | Status: DC

## 2023-08-14 ENCOUNTER — Other Ambulatory Visit: Payer: Self-pay

## 2023-08-14 ENCOUNTER — Encounter (HOSPITAL_COMMUNITY): Payer: Self-pay

## 2023-08-14 ENCOUNTER — Emergency Department (HOSPITAL_COMMUNITY)
Admission: EM | Admit: 2023-08-14 | Discharge: 2023-08-15 | Disposition: A | Discharge status: Home / Self Care | Source: Skilled Nursing Facility | Attending: Emergency Medicine | Admitting: Emergency Medicine

## 2023-08-14 DIAGNOSIS — T783XXA Angioneurotic edema, initial encounter: Secondary | ICD-10-CM | POA: Diagnosis not present

## 2023-08-14 DIAGNOSIS — T7840XA Allergy, unspecified, initial encounter: Secondary | ICD-10-CM | POA: Diagnosis present

## 2023-08-14 LAB — CBC WITH DIFFERENTIAL/PLATELET
Abs Immature Granulocytes: 0.02 10*3/uL (ref 0.00–0.07)
Basophils Absolute: 0.1 10*3/uL (ref 0.0–0.1)
Basophils Relative: 1 %
Eosinophils Absolute: 0.2 10*3/uL (ref 0.0–0.5)
Eosinophils Relative: 3 %
HCT: 30.2 % — ABNORMAL LOW (ref 36.0–46.0)
Hemoglobin: 10.2 g/dL — ABNORMAL LOW (ref 12.0–15.0)
Immature Granulocytes: 0 %
Lymphocytes Relative: 22 %
Lymphs Abs: 1.3 10*3/uL (ref 0.7–4.0)
MCH: 32.5 pg (ref 26.0–34.0)
MCHC: 33.8 g/dL (ref 30.0–36.0)
MCV: 96.2 fL (ref 80.0–100.0)
Monocytes Absolute: 0.5 10*3/uL (ref 0.1–1.0)
Monocytes Relative: 9 %
Neutro Abs: 4 10*3/uL (ref 1.7–7.7)
Neutrophils Relative %: 65 %
Platelets: 193 10*3/uL (ref 150–400)
RBC: 3.14 MIL/uL — ABNORMAL LOW (ref 3.87–5.11)
RDW: 14.3 % (ref 11.5–15.5)
WBC: 6.1 10*3/uL (ref 4.0–10.5)
nRBC: 0 % (ref 0.0–0.2)

## 2023-08-14 LAB — BASIC METABOLIC PANEL WITH GFR
Anion gap: 8 (ref 5–15)
BUN: 20 mg/dL (ref 8–23)
CO2: 22 mmol/L (ref 22–32)
Calcium: 8 mg/dL — ABNORMAL LOW (ref 8.9–10.3)
Chloride: 106 mmol/L (ref 98–111)
Creatinine, Ser: 1.49 mg/dL — ABNORMAL HIGH (ref 0.44–1.00)
GFR, Estimated: 40 mL/min — ABNORMAL LOW (ref >60)
Glucose, Bld: 86 mg/dL (ref 70–99)
Potassium: 4.1 mmol/L (ref 3.5–5.1)
Sodium: 136 mmol/L (ref 135–145)

## 2023-08-14 MED ORDER — DIPHENHYDRAMINE HCL 50 MG/ML IJ SOLN
50.0000 mg | Freq: Once | INTRAMUSCULAR | Status: AC
  Administered 2023-08-14: 50 mg via INTRAVENOUS
  Filled 2023-08-14: qty 1

## 2023-08-14 MED ORDER — DIPHENHYDRAMINE HCL 25 MG PO TABS: 25.0000 mg | ORAL_TABLET | Freq: Four times a day (QID) | ORAL | 0 refills | Status: AC | PRN

## 2023-08-14 MED ORDER — FAMOTIDINE IN NACL 20-0.9 MG/50ML-% IV SOLN
20.0000 mg | Freq: Once | INTRAVENOUS | Status: AC
Start: 2023-08-14 — End: 2023-08-14
  Administered 2023-08-14: 20 mg via INTRAVENOUS
  Filled 2023-08-14: qty 50

## 2023-08-14 MED ORDER — PREDNISONE 20 MG PO TABS: ORAL_TABLET | ORAL | 0 refills | Status: DC

## 2023-08-14 MED ORDER — METHYLPREDNISOLONE SODIUM SUCC 125 MG IJ SOLR
125.0000 mg | Freq: Once | INTRAMUSCULAR | Status: AC
Start: 2023-08-14 — End: 2023-08-14
  Administered 2023-08-14: 125 mg via INTRAVENOUS
  Filled 2023-08-14: qty 2

## 2023-08-14 MED ORDER — SODIUM CHLORIDE 0.9 % IV BOLUS
1000.0000 mL | Freq: Once | INTRAVENOUS | Status: AC
  Administered 2023-08-14: 1000 mL via INTRAVENOUS

## 2023-08-14 NOTE — ED Provider Notes (Signed)
 I provided a substantive portion of the care of this patient.  I personally made/approved the management plan for this patient and take responsibility for the patient management.     Patient was recently started on multiple new medications including lisinopril .  She started to get swelling to the outer corner of her left upper lip.  She reports that started around 2 in the afternoon and then it proceeded to include all of the upper lip.  The patient denies that she has any difficulty swallowing or breathing.  Does not perceive any swelling of her tongue or throat.  Patient has symmetric swelling of the upper lip.  Oral cavity widely patent.  No tongue swelling no stridor or difficulty swallowing or breathing.  I agree with plan of management.   Armenta Canning, MD 08/14/23 2039

## 2023-08-14 NOTE — Discharge Instructions (Signed)
 1.  You have a condition called angioedema.  Please review the educational information in your discharge instructions.  You are taking a blood pressure medication that is commonly known to cause this problem.  That medication is called lisinopril  (Zestril ).  This medication belongs to a class of medication called ACE inhibitors.  You can NEVER TAKE BLOOD PRESSURE MEDICATIONS IN THE CLASS OF ACE INHIBITORS.SABRA  This is a reaction that can occur even when someone has been taking this medication for a number of years.  Once this reaction occurs, it is too dangerous to continue this medication.  It can cause swelling in the airway. 2.  Continue to take Benadryl and prednisone for the next 3 days.  Return to the emergency department immediately if you feel any swelling in your throat or tongue.  If you feel that swallowing is difficult for that breathing is difficult anyway, return immediately.

## 2023-08-14 NOTE — ED Provider Notes (Signed)
 [place] EMERGENCY DEPARTMENT AT [facility] Provider Note   CSN: [id number] Arrival date & time: 08/14/23  8084     Patient presents with: Allergic Reaction   Gwendolyn Thompson is a 63 y.o. female.   The history is provided by the patient, the EMS personnel and medical records. No language interpreter was used.  Allergic Reaction    63 year old female send history of alcohol abuse brought here via EMS for evaluation of lip swelling.  Patient states that she has a longstanding history of alcohol abuse, drinking vodka, and she recently got admitted to a alcohol Anonymous group.  She mentions several of her medication was changed and she is now taking new medications as well as eating and drinking food that she normally does not eat or drink.  She just darted a full day yesterday and today when she noticed swelling to her upper lip that started approximately 6 hours ago.  Initially she did notice tongue swelling but that has since improved.  She feels a bit tired but denies any significant headache, trouble swallowing, chest pain, trouble breathing, wheezing, abdominal cramping, nausea or vomiting.  She is on lisinopril  but has been taking this medication for an extended period of time without complication.  Remote history of allergic reaction to penicillin.  Prior to Admission medications   Medication Sig Start Date End Date Taking? Authorizing Provider  AMBULATORY NON FORMULARY MEDICATION Take 4 tablets by mouth daily. ALJ Herbal Respiratory  at breakfast    [provider]  Calcium  Carbonate-Vitamin D3 600-400 MG-UNIT TABS Take 1 tablet by mouth daily.    [provider]  cetirizine (ZYRTEC) 10 MG chewable tablet Chew 10 mg by mouth daily.    [provider]  diltiazem  (CARDIZEM  CD) 360 MG 24 hr capsule TAKE 1 CAPSULE BY MOUTH EVERY DAY 10/23/22   Ladona Heinz, MD  dronedarone  (MULTAQ ) 400 MG tablet Take 1 tablet (400 mg total) by mouth 2 (two) times  daily with a meal. 06/04/23   Ladona Heinz, MD  fluticasone (FLONASE) 50 MCG/ACT nasal spray Place 1 spray into both nostrils daily. 01/02/18   [provider]  lisinopril  (ZESTRIL ) 20 MG tablet Take 1 tablet (20 mg total) by mouth every evening. 07/27/23   Ladona Heinz, MD  pantoprazole  (PROTONIX ) 40 MG tablet Take 40 mg by mouth daily.    [provider]  propranolol  ER (INDERAL  LA) 120 MG 24 hr capsule 1 tablet daily 01/19/23   Ganji, Jay, MD  Propylene Glycol (SYSTANE COMPLETE OP) Place 1 drop into both eyes daily as needed (Dry eyes).    [provider]  venlafaxine (EFFEXOR) 75 MG tablet Take 75 mg by mouth daily.     [provider]    Allergies: Penicillins    Review of Systems  All other systems reviewed and are negative.   Updated Vital Signs BP (!) 88/56 (BP Location: Right Arm)   Pulse 72   Temp 98.7 F (37.1 C) (Oral)   Resp 18   Ht 5' 1 (1.549 m)   Wt 56 kg   SpO2 98%   BMI 23.33 kg/m   Physical Exam Vitals and nursing note reviewed.  Constitutional:      General: She is not in acute distress.    Appearance: She is well-developed.  HENT:     Head: Atraumatic.     Mouth/Throat:     Comments: Upper lip is edematous.  Tongue is normal, oral mucosal normal, uvula midline,  normal phonation  Eyes:     Conjunctiva/sclera: Conjunctivae normal.    Cardiovascular:     Rate and Rhythm: Normal rate and regular rhythm.     Pulses: Normal pulses.     Heart sounds: Normal heart sounds.  Pulmonary:     Effort: Pulmonary effort is normal.  Abdominal:     Palpations: Abdomen is soft.     Tenderness: There is no abdominal tenderness.   Musculoskeletal:     Cervical back: Neck supple.     Right lower leg: No edema.     Left lower leg: No edema.   Skin:    Findings: No rash.   Neurological:     Mental Status: She is alert. Mental status is at baseline.   Psychiatric:        Mood and Affect: Mood normal.     (all labs ordered  are listed, but only abnormal results are displayed) Labs Reviewed  BASIC METABOLIC PANEL WITH GFR  CBC WITH DIFFERENTIAL/PLATELET    EKG: None  Radiology: No results found.   Procedures   Medications Ordered in the ED  sodium chloride  0.9 % bolus 1,000 mL (has no administration in time range)  methylPREDNISolone sodium succinate (SOLU-MEDROL) 125 mg/2 mL injection 125 mg (has no administration in time range)  diphenhydrAMINE (BENADRYL) injection 50 mg (has no administration in time range)  famotidine (PEPCID) IVPB 20 mg premix (has no administration in time range)                                    Medical Decision Making Amount and/or Complexity of Data Reviewed Labs: ordered.  Risk Prescription drug management.   BP (!) 88/56 (BP Location: Right Arm)   Pulse 72   Temp 98.7 F (37.1 C) (Oral)   Resp 18   Ht 5' 1 (1.549 m)   Wt 56 kg   SpO2 98%   BMI 23.33 kg/m   62:54 PM  63 year old female history of alcohol abuse brought here via EMS for evaluation of lip swelling.  Patient states that she has a longstanding history of alcohol abuse, drinking vodka, and she recently got admitted to a alcohol Anonymous group.  She mentions several of her medication was changed and she is now taking new medications as well as eating and drinking food that she normally does not eat or drink.  She just darted a full day yesterday and today when she noticed swelling to her upper lip that started approximately 6 hours ago.  Initially she did notice tongue swelling but that has since improved.  She feels a bit tired but denies any significant headache, trouble swallowing, chest pain, trouble breathing, wheezing, abdominal cramping, nausea or vomiting.  She is on lisinopril  but has been taking this medication for an extended period of time without complication.  Remote history of allergic reaction to penicillin.  On exam patient has obvious angioedema involving her upper lip.  Tongue is  normal, no swelling to the lower lip, normal phonation, lungs are clear abdomen soft nontender no concerning urticarial rash.  Patient is mentating appropriately.  Her vital signs remarkable for a blood pressure of 88/56.  She is not tachycardic she is not febrile and she is not hypoxic.  Unsure of the obvious cause of her angioedema as patient reports she recently had several new medications as well as eating and drinking new food since yesterday as she  entered an alcohol Anonymous rehab group.  She is currently on lisinopril  but has been taking it for quite a while.  -Labs ordered, but have not resulted yet -The patient was maintained on a cardiac monitor.  I personally viewed and interpreted the cardiac monitored which showed an underlying rhythm of: NSR -Imaging not considered -This patient presents to the ED for concern of lip swelling, this involves an extensive number of treatment options, and is a complaint that carries with it a high risk of complications and morbidity.  The differential diagnosis includes angioedema, allergic reaction, abscess, cellulitis,  -Co morbidities that complicate the patient evaluation includes alcohol abuse -Treatment includes solumedrol, benadryl, pepcid -Reevaluation of the patient after these medicines showed that the patient stayed the same -PCP office notes or outside notes reviewed -Discussion with attending Dr. Armenta who will determine disposition -Escalation to admission/observation considered: dispo pending      Final diagnoses:  Angioedema, initial encounter    ED Discharge Orders     None          Nivia Colon, PA-C 08/14/23 2144    Armenta Canning, MD 08/15/23 1530

## 2023-08-14 NOTE — ED Triage Notes (Signed)
 PT bib by [facility], Per EMS PT is having a suspected allergic reaction, with upper lip swelling, PT denies [facility] or other symptoms. Unknown cause of reaction.

## 2023-09-07 ENCOUNTER — Emergency Department (HOSPITAL_COMMUNITY)

## 2023-09-07 ENCOUNTER — Encounter (HOSPITAL_COMMUNITY): Payer: Self-pay | Admitting: Emergency Medicine

## 2023-09-07 ENCOUNTER — Other Ambulatory Visit: Payer: Self-pay

## 2023-09-07 ENCOUNTER — Emergency Department (HOSPITAL_COMMUNITY)
Admission: EM | Admit: 2023-09-07 | Discharge: 2023-09-08 | Disposition: A | Discharge status: Home / Self Care | Source: Other Acute Inpatient Hospital | Attending: Emergency Medicine | Admitting: Emergency Medicine

## 2023-09-07 DIAGNOSIS — E871 Hypo-osmolality and hyponatremia: Secondary | ICD-10-CM | POA: Diagnosis not present

## 2023-09-07 DIAGNOSIS — R079 Chest pain, unspecified: Secondary | ICD-10-CM | POA: Diagnosis present

## 2023-09-07 LAB — COMPREHENSIVE METABOLIC PANEL WITH GFR
ALT: 27 U/L (ref 0–44)
AST: 34 U/L (ref 15–41)
Albumin: 3.7 g/dL (ref 3.5–5.0)
Alkaline Phosphatase: 89 U/L (ref 38–126)
Anion gap: 9 (ref 5–15)
BUN: 13 mg/dL (ref 8–23)
CO2: 23 mmol/L (ref 22–32)
Calcium: 9.5 mg/dL (ref 8.9–10.3)
Chloride: 96 mmol/L — ABNORMAL LOW (ref 98–111)
Creatinine, Ser: 1.07 mg/dL — ABNORMAL HIGH (ref 0.44–1.00)
GFR, Estimated: 59 mL/min — ABNORMAL LOW (ref >60)
Glucose, Bld: 98 mg/dL (ref 70–99)
Potassium: 4.2 mmol/L (ref 3.5–5.1)
Sodium: 128 mmol/L — ABNORMAL LOW (ref 135–145)
Total Bilirubin: 0.5 mg/dL (ref 0.0–1.2)
Total Protein: 6.8 g/dL (ref 6.5–8.1)

## 2023-09-07 LAB — CBC
HCT: 30 % — ABNORMAL LOW (ref 36.0–46.0)
Hemoglobin: 10.5 g/dL — ABNORMAL LOW (ref 12.0–15.0)
MCH: 31 pg (ref 26.0–34.0)
MCHC: 35 g/dL (ref 30.0–36.0)
MCV: 88.5 fL (ref 80.0–100.0)
Platelets: 328 K/uL (ref 150–400)
RBC: 3.39 MIL/uL — ABNORMAL LOW (ref 3.87–5.11)
RDW: 12.6 % (ref 11.5–15.5)
WBC: 7.3 K/uL (ref 4.0–10.5)
nRBC: 0 % (ref 0.0–0.2)

## 2023-09-07 LAB — TSH: TSH: 1.52 u[IU]/mL (ref 0.350–4.500)

## 2023-09-07 LAB — TROPONIN I (HIGH SENSITIVITY)
Troponin I (High Sensitivity): 7 ng/L (ref <18)
Troponin I (High Sensitivity): 6 ng/L (ref <18)

## 2023-09-07 LAB — ETHANOL: Alcohol, Ethyl (B): <15 mg/dL (ref <15)

## 2023-09-07 MED ORDER — SODIUM CHLORIDE 0.9 % IV BOLUS
1000.0000 mL | Freq: Once | INTRAVENOUS | Status: AC
  Administered 2023-09-07: 1000 mL via INTRAVENOUS

## 2023-09-07 NOTE — Discharge Instructions (Signed)
 As we discussed, your sodium is 128 which is around your baseline level.  Your baseline level is between 128 and 130  I recommend you stay hydrated.  Repeat sodium level in a week  Your heart enzyme test is normal today  You can follow-up with your primary care doctor  Return to ER if you have worse chest pain or passing out

## 2023-09-07 NOTE — ED Triage Notes (Addendum)
 Pt via EMS from [facility] due to reported hyponatremia, 127 per facility. Pt feels intermittent CP but otherwise no acute symptoms. Received 324mg  aspirin en route.

## 2023-09-07 NOTE — ED Notes (Signed)
 Spoke with facility. Pt to be picked up.

## 2023-09-07 NOTE — ED Provider Notes (Signed)
 [place] EMERGENCY DEPARTMENT AT [facility] Provider Note   CSN: [id number] Arrival date & time: 09/07/23  1649     Patient presents with: Abnormal Lab   Gwendolyn Thompson is a 63 y.o. female history of alcohol abuse, angioedema from lisinopril , anxiety here presenting with chest pain and hyponatremia.  Patient has been evaluated well for about 30 days now.  She states that she has not been drinking any alcohol.  She had labs drawn several days ago and showed sodium of 127.  Patient was given mixed messages whether she should be holding off of fluids or drinking more fluids.  Patient was told to get a recheck today.  She also has some mild chest pain today.  She states that she is not nauseated and no vomiting.  Denies any shortness of breath.  Denies any cardiac history   The history is provided by the patient.       Prior to Admission medications   Medication Sig Start Date End Date Taking? Authorizing Provider  AMBULATORY NON FORMULARY MEDICATION Take 4 tablets by mouth daily. ALJ Herbal Respiratory  at breakfast    [provider]  Calcium  Carbonate-Vitamin D3 600-400 MG-UNIT TABS Take 1 tablet by mouth daily.    [provider]  cetirizine (ZYRTEC) 10 MG chewable tablet Chew 10 mg by mouth daily.    [provider]  diltiazem  (CARDIZEM  CD) 360 MG 24 hr capsule TAKE 1 CAPSULE BY MOUTH EVERY DAY 10/23/22   Ladona Heinz, MD  diphenhydrAMINE  (BENADRYL ) 25 MG tablet Take 1 tablet (25 mg total) by mouth every 6 (six) hours as needed for allergies. 08/14/23   Armenta Canning, MD  dronedarone  (MULTAQ ) 400 MG tablet Take 1 tablet (400 mg total) by mouth 2 (two) times daily with a meal. 06/04/23   Ladona Heinz, MD  fluticasone (FLONASE) 50 MCG/ACT nasal spray Place 1 spray into both nostrils daily. 01/02/18   [provider]  lisinopril  (ZESTRIL ) 20 MG tablet Take 1 tablet (20 mg total) by mouth every evening. 07/27/23   Ladona Heinz, MD  pantoprazole   (PROTONIX ) 40 MG tablet Take 40 mg by mouth daily.    [provider]  predniSONE  (DELTASONE ) 20 MG tablet 2 tabs po daily x 3 days 08/14/23   Armenta Canning, MD  propranolol  ER (INDERAL  LA) 120 MG 24 hr capsule 1 tablet daily 01/19/23   Ganji, Jay, MD  Propylene Glycol (SYSTANE COMPLETE OP) Place 1 drop into both eyes daily as needed (Dry eyes).    [provider]  venlafaxine (EFFEXOR) 75 MG tablet Take 75 mg by mouth daily.     [provider]    Allergies: Penicillins    Review of Systems  Cardiovascular:  Positive for chest pain.  All other systems reviewed and are negative.   Updated Vital Signs BP 122/69   Pulse 62   Temp 98.3 F (36.8 C) (Oral)   Resp 14   Ht 5' 1 (1.549 m)   Wt 56.7 kg   SpO2 98%   BMI 23.62 kg/m   Physical Exam Vitals and nursing note reviewed.  Constitutional:      Appearance: Normal appearance.     Comments: Slightly dehydrated  HENT:     Head: Normocephalic.     Nose: Nose normal.     Mouth/Throat:     Mouth: Mucous membranes are dry.  Eyes:     Extraocular Movements: Extraocular movements intact.     Pupils: Pupils are  equal, round, and reactive to light.  Cardiovascular:     Rate and Rhythm: Normal rate and regular rhythm.     Pulses: Normal pulses.     Heart sounds: Normal heart sounds.  Pulmonary:     Effort: Pulmonary effort is normal.     Breath sounds: Normal breath sounds.  Abdominal:     General: Abdomen is flat.     Palpations: Abdomen is soft.  Musculoskeletal:        General: Normal range of motion.     Cervical back: Normal range of motion and neck supple.  Skin:    General: Skin is warm.     Capillary Refill: Capillary refill takes less than 2 seconds.  Neurological:     General: No focal deficit present.     Mental Status: She is alert and oriented to person, place, and time.  Psychiatric:        Mood and Affect: Mood normal.        Behavior: Behavior normal.     (all labs ordered  are listed, but only abnormal results are displayed) Labs Reviewed  CBC - Abnormal; Notable for the following components:      Result Value   RBC 3.39 (*)    Hemoglobin 10.5 (*)    HCT 30.0 (*)    All other components within normal limits  COMPREHENSIVE METABOLIC PANEL WITH GFR - Abnormal; Notable for the following components:   Sodium 128 (*)    Chloride 96 (*)    Creatinine, Ser 1.07 (*)    GFR, Estimated 59 (*)    All other components within normal limits  ETHANOL  TSH  TROPONIN I (HIGH SENSITIVITY)  TROPONIN I (HIGH SENSITIVITY)    EKG: EKG Interpretation Date/Time:  Friday September 07 2023 17:27:46 EDT Ventricular Rate:  66 PR Interval:  186 QRS Duration:  74 QT Interval:  462 QTC Calculation: 484 R Axis:   34  Text Interpretation: Sinus rhythm with Premature atrial complexes T wave abnormality, consider anterior ischemia Abnormal ECG When compared with ECG of 24-Apr-2023 09:02, PREVIOUS ECG IS PRESENT Confirmed by Patt Alm DEL [phone]) on 09/07/2023 6:52:36 PM  Radiology: No results found.   Procedures   Medications Ordered in the ED  sodium chloride  0.9 % bolus 1,000 mL (has no administration in time range)                                    Medical Decision Making Gwendolyn Thompson is a 63 y.o. female here presenting with chest pain and hyponatremia.  Patient's baseline sodium is around 128-130.  Patient has not been drinking alcohol so I do not think she has beer Poto mania.  I think likely dehydration or chronic alcohol abuse causing her hyponatremia.  Patient has low risk for ACS so we will get troponin x 2.  I have low suspicion for PE or dissection.  Plan to get CBC and CMP and troponin x 2 and chest x-ray. Will hydrate patient.  10:24 PM I reviewed patient's labs and sodium is 128.  Troponin negative x 2.  Patient given IV fluids.  Her baseline sodium is around 128-130.  I told her to stay hydrated and she is safe to go back to [facility]   Problems  Addressed: Chest pain, unspecified type: acute illness or injury Hyponatremia: acute illness or injury  Amount and/or Complexity of Data Reviewed Labs:  ordered. Decision-making details documented in ED Course. Radiology: ordered and independent interpretation performed. ECG/medicine tests: ordered and independent interpretation performed. Decision-making details documented in ED Course.     Final diagnoses:  None    ED Discharge Orders     None          Patt Alm Macho, MD 09/07/23 2224

## 2023-09-23 ENCOUNTER — Encounter: Payer: Self-pay | Admitting: Cardiology

## 2023-09-24 NOTE — Telephone Encounter (Signed)
 Please schedule for office visit in 4 to 6 weeks for follow-up of hypertension, PAF and hyperlipidemia.

## 2023-10-16 ENCOUNTER — Encounter (HOSPITAL_BASED_OUTPATIENT_CLINIC_OR_DEPARTMENT_OTHER): Payer: Self-pay

## 2023-10-18 ENCOUNTER — Encounter: Payer: Self-pay | Admitting: Cardiology

## 2023-10-18 ENCOUNTER — Ambulatory Visit: Payer: Self-pay | Attending: Cardiology | Admitting: Cardiology

## 2023-10-18 VITALS — BP 120/78 | HR 61 | Resp 16 | Ht 61.0 in | Wt 118.6 lb

## 2023-10-18 DIAGNOSIS — I480 Paroxysmal atrial fibrillation: Secondary | ICD-10-CM | POA: Diagnosis not present

## 2023-10-18 DIAGNOSIS — E871 Hypo-osmolality and hyponatremia: Secondary | ICD-10-CM

## 2023-10-18 DIAGNOSIS — I10 Essential (primary) hypertension: Secondary | ICD-10-CM | POA: Diagnosis not present

## 2023-10-18 DIAGNOSIS — E7800 Pure hypercholesterolemia, unspecified: Secondary | ICD-10-CM

## 2023-10-18 DIAGNOSIS — K7010 Alcoholic hepatitis without ascites: Secondary | ICD-10-CM | POA: Diagnosis not present

## 2023-10-18 LAB — LIPID PANEL

## 2023-10-18 NOTE — Patient Instructions (Signed)
 Medication Instructions:  No medication changes were made at this visit. Continue current regimen.   *If you need a refill on your cardiac medications before your next appointment, please call your pharmacy*  Lab Work TODAY: LIPID PANEL CMP If you have labs (blood work) drawn today and your tests are completely normal, you will receive your results only by: MyChart Message (if you have MyChart) OR A paper copy in the mail If you have any lab test that is abnormal or we need to change your treatment, we will call you to review the results.  Testing/Procedures: NONE  Follow-Up: At [facility], you and your health needs are our priority.  As part of our continuing mission to provide you with exceptional heart care, our providers are all part of one team.  This team includes your primary Cardiologist (physician) and Advanced Practice Providers or APPs (Physician Assistants and Nurse Practitioners) who all work together to provide you with the care you need, when you need it.  Your next appointment:   1 Year  Provider:   Gordy Bergamo, MD    We recommend signing up for the patient portal called MyChart.  Sign up information is provided on this After Visit Summary.  MyChart is used to connect with patients for Virtual Visits (Telemedicine).  Patients are able to view lab/test results, encounter notes, upcoming appointments, etc.  Non-urgent messages can be sent to your provider as well.   To learn more about what you can do with MyChart, go to [email or web address].

## 2023-10-18 NOTE — Progress Notes (Signed)
 Cardiology Office Note:  .   Date:  10/18/2023  ID:  Olam Heron Pinal, DOB Jan 25, 1961, MRN [id number] PCP: Rosalva Doffing, NP  [facility] Providers Cardiologist:  Gordy Bergamo, MD   History of Present Illness: .   Jabree Pernice is a 63 y.o. Caucasian female  with hypertension, hyperlipidemia, history of basal cell carcinoma, with paroxysmal atrial fibrillation with failed cardioversion however spontaneously converted to sinus rhythm on Multaq . She does not want to be on anticoagulation.  She has not had any recurrence of atrial fibrillation since middle of 2022 and Multaq .    She also has history of alcoholic hepatitis and had markedly elevated liver enzymes. She quit drinking alcohol in June 30th 2025 and is in [place] program  This is a 61-month office visit.  She has lost significant amount of weight, has been successful in quitting alcohol.  She also appreciates that I stuck with her and encouraged her to join AA. Cardiac Studies relevent.    Lexiscan  Sestamibi Stress Test 09/11/2018:  Myocardial perfusion imaging is normal. Left ventricular ejection fraction is 74% with normal wall motion. Low risk study.   ECHOCARDIOGRAM COMPLETE 02/12/2023  1. Left ventricular ejection fraction, by estimation, is 60 to 65%. The left ventricle has normal function. The left ventricle has no regional wall motion abnormalities. There is mild left ventricular hypertrophy of the septal segment. Left ventricular diastolic parameters are consistent with Grade I diastolic dysfunction (impaired relaxation).    Discussed the use of AI scribe software for clinical note transcription with the patient, who gave verbal consent to proceed.  History of Present Illness Naileah Karg is a 63 year old female who presents for follow-up after completing a rehabilitation program.  She has completed a 60-day rehabilitation program and is currently attending an intensive outpatient program three days a week,  maintaining sobriety for 66 days. She attends Alcoholics Anonymous meetings daily.  She experienced a reaction to lisinopril  on July 1st, causing facial and lip swelling, which led to an emergency room visit. Her blood pressure medication was adjusted afterward. She is currently on Multaq  400 mg twice a day, diltiazem  CD 360 mg, propranolol  LA 120 mg once a day, and hydralazine 10 mg four times a day. Her blood pressure is occasionally elevated in the mornings.  She has a history of paroxysmal atrial fibrillation. She reports significant night sweats over the past two to three weeks, which she attributes to medication changes or post-acute alcohol withdrawal symptoms. She is concerned about her cholesterol levels, which were previously high, and is awaiting current results.  Labs   Lab Results  Component Value Date   CHOL 313 (H) 01/02/2023   HDL 54 01/02/2023   LDLCALC 182 (H) 01/02/2023   TRIG 389 (H) 01/02/2023   CHOLHDL 5.8 (H) 01/02/2023    Recent Labs    01/02/23 1001 08/14/23 2233 09/07/23 1724  NA 125* 136 128*  K 5.3* 4.1 4.2  CL 87* 106 96*  CO2 22 22 23   GLUCOSE 83 86 98  BUN 6* 20 13  CREATININE 0.82 1.49* 1.07*  CALCIUM  9.7 8.0* 9.5  GFRNONAA  --  40* 59*    Lab Results  Component Value Date   ALT 27 09/07/2023   AST 34 09/07/2023   ALKPHOS 89 09/07/2023   BILITOT 0.5 09/07/2023      Latest Ref Rng & Units 09/07/2023    5:23 PM 08/14/2023   10:33 PM 01/02/2023   10:01 AM  CBC  WBC 4.0 - 10.5 K/uL 7.3  6.1  6.3   Hemoglobin 12.0 - 15.0 g/dL 89.4  89.7  88.1   Hematocrit 36.0 - 46.0 % 30.0  30.2  34.8   Platelets 150 - 400 K/uL 328  193  358    No results found for: HGBA1C  Lab Results  Component Value Date   TSH 1.520 09/07/2023    Care everywhere/Faxed External Labs:  Labs 08/21/2022:  Serum glucose 89 mg, BUN 19, creatinine 1.05, eGFR 60 mL, alkaline phosphatase elevated at 170, AST and ALT are elevated at 43 and 48 respectively.  Hb 11.9/HCT  34.0, platelets 279.  ROS  Review of Systems  Cardiovascular:  Negative for chest pain, dyspnea on exertion and leg swelling.   Physical Exam:   VS:  BP 120/78 (BP Location: Left Arm, Patient Position: Sitting, Cuff Size: Normal)   Pulse 61   Resp 16   Ht 5' 1 (1.549 m)   Wt 118 lb 9.6 oz (53.8 kg)   SpO2 98%   BMI 22.41 kg/m    Wt Readings from Last 3 Encounters:  10/18/23 118 lb 9.6 oz (53.8 kg)  09/07/23 125 lb (56.7 kg)  08/14/23 123 lb 7.3 oz (56 kg)    BP Readings from Last 3 Encounters:  10/18/23 120/78  09/08/23 135/78  08/15/23 101/73   Physical Exam Neck:     Vascular: No carotid bruit or JVD.  Cardiovascular:     Rate and Rhythm: Normal rate and regular rhythm.     Pulses: Intact distal pulses.     Heart sounds: Normal heart sounds. No murmur heard.    No gallop.  Pulmonary:     Effort: Pulmonary effort is normal.     Breath sounds: Normal breath sounds.  Abdominal:     General: Bowel sounds are normal.     Palpations: Abdomen is soft.  Musculoskeletal:     Right lower leg: No edema.     Left lower leg: No edema.    EKG:         ASSESSMENT AND PLAN: .      ICD-10-CM   1. Paroxysmal atrial fibrillation (HCC)  I48.0     2. Essential hypertension  I10 Comprehensive metabolic panel with GFR    3. Hypercholesteremia  E78.00 Lipid panel    4. Alcoholic hepatitis without ascites  K70.10     5. Hyponatremia  E87.1      Assessment & Plan Alcohol use disorder in early remission 66 days sober following treatment at [facility]. Currently in intensive outpatient rehab and attending AA meetings daily with support from her wife. - Continue attending AA meetings daily - Continue intensive outpatient rehab program  Alcoholic hepatitis without ascites Liver enzymes have normalized post-alcohol cessation. Concerns about cirrhosis are alleviated with expected liver healing through continued abstinence. - Order CMP to monitor liver enzymes and sodium  levels - Advise continued abstinence from alcohol  Paroxysmal atrial fibrillation Maintaining regular rhythm on current medication regimen. No anticoagulation due to preference and stable rhythm. - Continue Multaq  400 mg twice a day - Continue monitoring heart rhythm - Overall CHA2DS2-VASc score is less than 3 and a female. - Click Here to Calculate/Change CHADS2VASc Score The patient's CHADS2-VASc score is 2, indicating a 2.2% annual risk of stroke.  Therefore, anticoagulation is not recommended.   CHF History: No HTN History: Yes Diabetes History: No Stroke History: No Vascular Disease History: No   Essential hypertension Blood pressure is generally  well-controlled on current medication regimen, with occasional morning elevations. - Continue diltiazem  CD 360 mg once a day - Continue propranolol  LA 120 mg once a day - Adjust hydralazine to 10 mg three times a day, with option to increase to four times a day if needed  Hyponatremia Previous episode managed in hospital. Current sodium levels are within normal range. - Order CMP to monitor sodium levels  Hypercholesterolemia Cholesterol levels not recently checked. Previous levels were high; weight loss may have improved them. - Order cholesterol test - Consider starting lipid-lowering therapy based on test results  Night sweats likely related to post-acute alcohol withdrawal Experiencing significant night sweats, likely related to post-acute alcohol withdrawal. No evidence of thyroid  dysfunction or medication side effects. - Continue current medications for cravings and sleep   Follow up: 1 year or sooner if problems.  Signed,  Gordy Bergamo, MD, [facility] 10/18/2023, 4:37 PM [facility] [address] [place], [place] [address] Phone: [phone]. Fax:  [phone]

## 2023-10-19 ENCOUNTER — Ambulatory Visit: Payer: Self-pay | Admitting: Cardiology

## 2023-10-19 ENCOUNTER — Other Ambulatory Visit: Payer: Self-pay | Admitting: Cardiology

## 2023-10-19 LAB — LIPID PANEL
Cholesterol, Total: 238 mg/dL — AB (ref 100–199)
HDL: 51 mg/dL (ref >39)
LDL CALC COMMENT:: 4.7 ratio — AB (ref 0.0–4.4)
LDL Chol Calc (NIH): 146 mg/dL — AB (ref 0–99)
Triglycerides: 229 mg/dL — AB (ref 0–149)
VLDL Cholesterol Cal: 41 mg/dL — AB (ref 5–40)

## 2023-10-19 LAB — COMPREHENSIVE METABOLIC PANEL WITH GFR
ALT: 16 IU/L (ref 0–32)
AST: 23 IU/L (ref 0–40)
Albumin: 4.6 g/dL (ref 3.9–4.9)
Alkaline Phosphatase: 112 IU/L (ref 44–121)
BUN/Creatinine Ratio: 11 — ABNORMAL LOW (ref 12–28)
BUN: 12 mg/dL (ref 8–27)
Bilirubin Total: 0.3 mg/dL (ref 0.0–1.2)
CO2: 24 mmol/L (ref 20–29)
Calcium: 10 mg/dL (ref 8.7–10.3)
Chloride: 96 mmol/L (ref 96–106)
Creatinine, Ser: 1.08 mg/dL — ABNORMAL HIGH (ref 0.57–1.00)
Globulin, Total: 3.2 g/dL (ref 1.5–4.5)
Glucose: 79 mg/dL (ref 70–99)
Potassium: 5.4 mmol/L — ABNORMAL HIGH (ref 3.5–5.2)
Sodium: 135 mmol/L (ref 134–144)
Total Protein: 7.8 g/dL (ref 6.0–8.5)
eGFR: 58 mL/min/1.73 — ABNORMAL LOW (ref >59)

## 2023-10-19 NOTE — Progress Notes (Signed)
 I have called her PCP and left message about my recommendations and my contacts

## 2023-10-24 ENCOUNTER — Encounter: Payer: Self-pay | Admitting: Cardiology

## 2023-11-08 ENCOUNTER — Ambulatory Visit: Admitting: Cardiology

## 2023-11-09 ENCOUNTER — Encounter: Payer: Self-pay | Admitting: Cardiology

## 2023-11-09 ENCOUNTER — Other Ambulatory Visit: Payer: Self-pay | Admitting: Cardiology

## 2023-11-09 ENCOUNTER — Telehealth: Payer: Self-pay | Admitting: Cardiology

## 2023-11-09 DIAGNOSIS — I480 Paroxysmal atrial fibrillation: Secondary | ICD-10-CM

## 2023-11-09 DIAGNOSIS — K7010 Alcoholic hepatitis without ascites: Secondary | ICD-10-CM

## 2023-11-09 MED ORDER — MULTAQ 400 MG PO TABS: 400.0000 mg | ORAL_TABLET | Freq: Two times a day (BID) | ORAL | 3 refills | Status: AC

## 2023-11-09 NOTE — Telephone Encounter (Signed)
 Pt is requesting Dr. Ladona to refill non cardiac medications naltrexone  and trazodone. Dr. Ladona did not prescribe these medications. Would Dr. Ladona like to refill these non cardiac medications? Please address

## 2023-11-09 NOTE — Telephone Encounter (Signed)
 Patient called to follow-up on getting naltrexone  and trazodone medications refilled but is concerned she may not get a response from her PCP in time.  Patient noted she can also be reached at [phone].

## 2023-11-09 NOTE — Telephone Encounter (Signed)
 I spoke with patient.  See phone note from today

## 2023-11-09 NOTE — Telephone Encounter (Signed)
 I spoke with patient and let her know Dr Ladona did not prescribe these medications for her and he could not refill.  I asked her to reach out again to her PCP to request refills.

## 2024-01-29 ENCOUNTER — Encounter: Payer: Self-pay | Admitting: Cardiology

## 2024-01-29 DIAGNOSIS — I480 Paroxysmal atrial fibrillation: Secondary | ICD-10-CM

## 2024-01-29 DIAGNOSIS — F411 Generalized anxiety disorder: Secondary | ICD-10-CM

## 2024-01-29 MED ORDER — PROPRANOLOL HCL ER 120 MG PO CP24: ORAL_CAPSULE | ORAL | 3 refills | Status: AC

## 2024-02-01 ENCOUNTER — Encounter: Payer: Self-pay | Admitting: Cardiology

## 2024-02-18 ENCOUNTER — Encounter: Payer: Self-pay | Admitting: Cardiology
